# Patient Record
Sex: Male | Born: 1975 | Race: Black or African American | Hispanic: No | Marital: Married | Smoking: Never smoker
Health system: Southern US, Community
[De-identification: ages and names within clinical notes are randomized; demographics above are authoritative.]

## PROBLEM LIST (undated history)

## (undated) DIAGNOSIS — I10 Essential (primary) hypertension: Secondary | ICD-10-CM

## (undated) DIAGNOSIS — R52 Pain, unspecified: Secondary | ICD-10-CM

## (undated) DIAGNOSIS — E119 Type 2 diabetes mellitus without complications: Secondary | ICD-10-CM

## (undated) HISTORY — DX: Pain, unspecified: R52

## (undated) HISTORY — PX: CHEEK AUGMENTATION: SHX1335

## (undated) HISTORY — DX: Type 2 diabetes mellitus without complications: E11.9

---
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
## 2005-06-22 HISTORY — PX: FINGER SURGERY: SHX640

## 2008-06-25 ENCOUNTER — Emergency Department (HOSPITAL_COMMUNITY): Admission: EM | Admit: 2008-06-25 | Discharge: 2008-06-26 | Payer: Self-pay | Admitting: Emergency Medicine

## 2010-10-06 LAB — RAPID URINE DRUG SCREEN, HOSP PERFORMED
Amphetamines: NOT DETECTED
Barbiturates: NOT DETECTED
Benzodiazepines: NOT DETECTED
Cocaine: NOT DETECTED
Opiates: POSITIVE — AB
Tetrahydrocannabinol: NOT DETECTED

## 2010-11-04 NOTE — Consult Note (Signed)
NAMEALLEX, LAPOINT              ACCOUNT NO.:  [id number]   MEDICAL RECORD NO.:  [id number]          PATIENT TYPE:  EMS   LOCATION:  MAJO                         FACILITY:  [facility]   PHYSICIAN:  [name], M.D.DATE OF BIRTH:  1976/02/19   DATE OF CONSULTATION:  06/25/2008  DATE OF DISCHARGE:                                 CONSULTATION   REQUESTING PHYSICIAN:  Orlene Och, MD   REASON FOR CONSULTATION:  Michael Shannon is a 35 year old right-hand-  dominant male who was injured earlier today at work who presents today  with traumatic soft tissue loss of volar aspect, nondominant left long  finger with exposed distal phalanx.  He is 35 years old.  He has an  allergy to penicillin.  He has no other medications.  No recent  hospitalizations or surgery.   PAST MEDICAL HISTORY:  Noncontributory.   SOCIAL HISTORY:  Noncontributory.   PHYSICAL EXAMINATION:  Examination reveals a well-developed and well-  nourished male, pleasant, alert, and oriented x3.  Examination of his  hand:  In the left, he has an obvious soft tissue defect over the volar  aspect of the long finger from the DIP flexion crease to about 5-7 mm  from the tip of the finger with exposed distal phalanx.  FDS is intact.  FDP is intact.  The tip of his finger remained sensate, although he has  a large soft tissue loss down to the bone.  X-rays show soft tissue loss  only.  The rest of his right and left upper extremity exams are normal  by comparison.   Approximately, a 35 year old male with significant soft tissue loss of  volar aspect, nondominant left long finger.  I had a thorough discussion  regarding this current predicament with Mr. Helzer in the emergency room  this evening.  He clearly has soft tissue loss with exposed tendon and  needs soft tissue coverage.  Recommendations would be to either proceed  with a revision amputation, which would encompass the DIP joint versus a  cross finger flap or  full-thickness skin grafting.  He does not wish to  proceed with amputation, which I think is not reasonable and I agree  with him that we should proceed with soft tissue coverage.  Therefore,  we have discussed either a full-thickness skin grafting versus a cross  finger flap.  He agrees on the cross finger flap, which will be done  from the ring finger to the long finger with full-thickness skin  grafting from the distal wrist crease.  We will do this in the operating  room tonight as an emergent case.      Artist Pais Mina Marble, M.D.  Electronically Signed     MAW/MEDQ  D:  06/25/2008  T:  06/26/2008  Job:  [id number]

## 2010-11-04 NOTE — Op Note (Signed)
NAMENEVAAN, Michael Shannon              ACCOUNT NO.:  [id number]   MEDICAL RECORD NO.:  [id number]          PATIENT TYPE:  EMS   LOCATION:  MAJO                         FACILITY:  [facility]   PHYSICIAN:  [name], M.D.DATE OF BIRTH:  07-Jul-1975   DATE OF PROCEDURE:  06/25/2008  DATE OF DISCHARGE:  06/26/2008                               OPERATIVE REPORT   PREOPERATIVE DIAGNOSIS:  Traumatic soft tissue loss volar aspect left  long finger.   POSTOPERATIVE DIAGNOSIS:  Traumatic soft tissue loss volar aspect left  long finger.   PROCEDURE:  Irrigation debridement above with left ring to left long  cross finger flap.   SURGEON:  Artist Pais. Mina Marble, MD   ASSISTANT:  None.   ANESTHESIA:  General.   TOURNIQUET TIME:  58 minutes.   COMPLICATION:  None.   DRAINS:  None.   OPERATIVE REPORT:  The patient was taken to the operating suite.  After  induction of adequate general anesthesia, the left upper extremity was  prepped and draped in the usual sterile fashion.  An Esmarch was used to  exsanguinate the limb.  Tourniquet was inflated to 250 mmHg.  At this  point in time, visualization revealed soft tissue loss over the pulp of  the left long finger with exposed distal phalanx.  This was irrigated  until all devitalized tissue was removed.  The defect was approximately  1.5 cm x 1.5 cm almost in square, from the DIP flexion crease to level  of the tip of the finger.  At this point in time, the hand was fully  pronated and a cross finger flap was elevated off the ring finger with a  radially based hinge swung into place and was deemed to be in good  position.  At this point in time, the hand was supinated again.  A full-  thickness skin graft was harvested from the distal wrist and forearm  crease.  This was then closed primarily with a 3-0 Prolene subcuticular  stitch with 1 horizontal mattress suture to decrease tension.  This full-  thickness skin graft was then placed over  the defect on the dorsal  aspect of the ring finger over the middle phalanx.  This was sewn on  with 4-0 nylon at the corners for future bolster sutures and a running 4-  0 Vicryl Rapide.  Once this was done, the flap was inset on the volar  aspect of the long finger and sutured with 4-0 Vicryl Rapide.  The inset  was finished.  After the inset was finished, a cotton soaked 4 x 4 was  placed on top of Xeroform on top of the full-thickness skin graft and  tied over using a bolster technique using the 4 corner sutures of 4-0  nylon.  After this was completed, the insetted flap was then dressed  with Xeroform and insetted flap was also sutured with 4-0 Vicryl Rapide.  At the end of the procedure, the donor site was dressed with Steri-  Strips  and Benzoin and 4 x 4s placed between the fingers and a compressive  dressing was applied  to the entire hand.  The wrist and fingers remained  flexed and the patient had dorsal extension block splint placed to  protect his cross-finger flap.  The patient tolerated all procedures  well and went to recovery room in stable fashion.      Artist Pais Mina Marble, M.D.  Electronically Signed     MAW/MEDQ  D:  06/25/2008  T:  06/26/2008  Job:  [id number]

## 2013-09-07 ENCOUNTER — Encounter: Payer: Managed Care, Other (non HMO) | Attending: Physician Assistant

## 2013-09-07 VITALS — Ht 69.0 in | Wt 238.2 lb

## 2013-09-07 DIAGNOSIS — E119 Type 2 diabetes mellitus without complications: Secondary | ICD-10-CM | POA: Insufficient documentation

## 2013-09-07 DIAGNOSIS — Z713 Dietary counseling and surveillance: Secondary | ICD-10-CM | POA: Insufficient documentation

## 2013-09-08 NOTE — Progress Notes (Signed)
Patient was seen on 09/07/13 for the first of a series of three diabetes self-management courses at the [facility].  Current HbA1c: 9.5%  The following learning objectives were met by the patient during this class:  Describe diabetes  State some common risk factors for diabetes  Defines the role of glucose and insulin  Identifies type of diabetes and pathophysiology  Describe the relationship between diabetes and cardiovascular risk  State the members of the Healthcare Team  States the rationale for glucose monitoring  State when to test glucose  State their individual Target Range  State the importance of logging glucose readings  Describe how to interpret glucose readings  Identifies A1C target  Explain the correlation between A1c and eAG values  State symptoms and treatment of high blood glucose  State symptoms and treatment of low blood glucose  Explain proper technique for glucose testing  Identifies proper sharps disposal  Handouts given during class include:  Living Well with Diabetes book  Carb Counting and Meal Planning book  Meal Plan Card  Carbohydrate guide  Meal planning worksheet  Low Sodium Flavoring Tips  The diabetes portion plate  H7S to eAG Conversion Chart  Diabetes Medications  Diabetes Recommended Care Schedule  Support Group  Diabetes Success Plan  Core Class Satisfaction Survey  Follow-Up Plan:  Attend core 2

## 2013-09-14 DIAGNOSIS — E119 Type 2 diabetes mellitus without complications: Secondary | ICD-10-CM

## 2013-09-14 NOTE — Progress Notes (Signed)
Patient was seen on 09/14/13 for the second of a series of three diabetes self-management courses at the [facility]. The following learning objectives were met by the patient during this class:   Describe the role of different macronutrients on glucose  Explain how carbohydrates affect blood glucose  State what foods contain the most carbohydrates  Demonstrate carbohydrate counting  Demonstrate how to read Nutrition Facts food label  Describe effects of various fats on heart health  Describe the importance of good nutrition for health and healthy eating strategies  Describe techniques for managing your shopping, cooking and meal planning  List strategies to follow meal plan when dining out  Describe the effects of alcohol on glucose and how to use it safely  Goals:  Follow Diabetes Meal Plan as instructed  Eat 3 meals and 2 snacks, every 3-5 hrs  Aim for carbohydrate intake to 60 grams carbohydrate/meal Aim for carbohydrate intake to 30 grams carbohydrate/snack Add lean protein foods to meals/snacks  Monitor glucose levels as instructed by your doctor   Follow-Up Plan:  Attend Core 3  Work towards following your personal food plan.

## 2013-09-21 ENCOUNTER — Encounter: Payer: Managed Care, Other (non HMO) | Attending: Physician Assistant

## 2013-09-21 DIAGNOSIS — E119 Type 2 diabetes mellitus without complications: Secondary | ICD-10-CM

## 2013-09-21 DIAGNOSIS — Z713 Dietary counseling and surveillance: Secondary | ICD-10-CM | POA: Insufficient documentation

## 2013-09-21 NOTE — Progress Notes (Signed)
Patient was seen on 09/21/13 for the third of a series of three diabetes self-management courses at the [facility]. The following learning objectives were met by the patient during this class:    State the amount of activity recommended for healthy living   Describe activities suitable for individual needs   Identify ways to regularly incorporate activity into daily life   Identify barriers to activity and ways to over come these barriers  Identify diabetes medications being personally used and their primary action for lowering glucose and possible side effects   Describe role of stress on blood glucose and develop strategies to address psychosocial issues   Identify diabetes complications and ways to prevent them  Explain how to manage diabetes during illness   Evaluate success in meeting personal goal   Establish 2-3 goals that they will plan to diligently work on until they return for the  59-month follow-up visit  Goals:  Follow Diabetes Meal Plan as instructed  Aim for 15-30 mins of physical activity daily as tolerated  Bring food record and glucose log to your follow up visit  Your patient has established the following 4 month goals in their individualized success plan: I will count my carb choices at most meals and snacks I will increase my activity level at least 5 days a week I will take my diabetes medications as scheduled   Your patient has identified these potential barriers to change:  Family getting fast food.   Preparing meals for when I go to work  Your patient has identified their diabetes self-care support plan as  [facility] Support Group  Wife and mother  Plan:  Attend Core 4 in 4 months

## 2014-01-22 ENCOUNTER — Encounter: Payer: Managed Care, Other (non HMO) | Attending: Physician Assistant

## 2018-05-03 ENCOUNTER — Ambulatory Visit
Admission: RE | Admit: 2018-05-03 | Discharge: 2018-05-03 | Disposition: A | Discharge status: Home / Self Care | Payer: Managed Care, Other (non HMO) | Source: Ambulatory Visit | Attending: Family Medicine | Admitting: Family Medicine

## 2018-05-03 ENCOUNTER — Other Ambulatory Visit: Payer: Self-pay | Admitting: Family Medicine

## 2018-05-03 DIAGNOSIS — M545 Low back pain, unspecified: Secondary | ICD-10-CM

## 2018-05-03 DIAGNOSIS — R6889 Other general symptoms and signs: Secondary | ICD-10-CM | POA: Diagnosis not present

## 2018-05-03 DIAGNOSIS — M5416 Radiculopathy, lumbar region: Secondary | ICD-10-CM | POA: Diagnosis not present

## 2018-05-03 DIAGNOSIS — R5383 Other fatigue: Secondary | ICD-10-CM | POA: Diagnosis not present

## 2018-05-03 DIAGNOSIS — R8290 Unspecified abnormal findings in urine: Secondary | ICD-10-CM | POA: Diagnosis not present

## 2018-05-03 DIAGNOSIS — E1165 Type 2 diabetes mellitus with hyperglycemia: Secondary | ICD-10-CM | POA: Diagnosis not present

## 2018-05-03 DIAGNOSIS — R634 Abnormal weight loss: Secondary | ICD-10-CM | POA: Diagnosis not present

## 2018-05-05 ENCOUNTER — Other Ambulatory Visit: Payer: Self-pay | Admitting: Family Medicine

## 2018-05-05 DIAGNOSIS — M545 Low back pain, unspecified: Secondary | ICD-10-CM

## 2018-05-27 ENCOUNTER — Ambulatory Visit
Admission: RE | Admit: 2018-05-27 | Discharge: 2018-05-27 | Disposition: A | Discharge status: Home / Self Care | Payer: BLUE CROSS/BLUE SHIELD | Source: Ambulatory Visit | Attending: Family Medicine | Admitting: Family Medicine

## 2018-05-27 DIAGNOSIS — M545 Low back pain, unspecified: Secondary | ICD-10-CM

## 2018-05-27 DIAGNOSIS — M47816 Spondylosis without myelopathy or radiculopathy, lumbar region: Secondary | ICD-10-CM | POA: Diagnosis not present

## 2018-05-27 MED ORDER — GADOBENATE DIMEGLUMINE 529 MG/ML IV SOLN
18.0000 mL | Freq: Once | INTRAVENOUS | Status: AC | PRN
  Administered 2018-05-27: 18 mL via INTRAVENOUS

## 2018-06-02 DIAGNOSIS — M545 Low back pain: Secondary | ICD-10-CM | POA: Diagnosis not present

## 2018-06-02 DIAGNOSIS — E1165 Type 2 diabetes mellitus with hyperglycemia: Secondary | ICD-10-CM | POA: Diagnosis not present

## 2018-06-21 DIAGNOSIS — M545 Low back pain, unspecified: Secondary | ICD-10-CM | POA: Insufficient documentation

## 2018-06-29 DIAGNOSIS — M545 Low back pain: Secondary | ICD-10-CM | POA: Diagnosis not present

## 2018-07-08 DIAGNOSIS — M545 Low back pain: Secondary | ICD-10-CM | POA: Diagnosis not present

## 2018-07-15 DIAGNOSIS — M545 Low back pain: Secondary | ICD-10-CM | POA: Diagnosis not present

## 2018-07-28 DIAGNOSIS — M545 Low back pain: Secondary | ICD-10-CM | POA: Diagnosis not present

## 2018-08-01 ENCOUNTER — Encounter: Payer: Self-pay | Admitting: *Deleted

## 2018-08-02 ENCOUNTER — Encounter: Payer: Self-pay | Admitting: Neurology

## 2018-08-02 ENCOUNTER — Ambulatory Visit (INDEPENDENT_AMBULATORY_CARE_PROVIDER_SITE_OTHER): Payer: BLUE CROSS/BLUE SHIELD | Admitting: Neurology

## 2018-08-02 VITALS — BP 140/79 | HR 92 | Ht 70.0 in | Wt 181.8 lb

## 2018-08-02 DIAGNOSIS — M79604 Pain in right leg: Secondary | ICD-10-CM | POA: Diagnosis not present

## 2018-08-02 DIAGNOSIS — M79605 Pain in left leg: Secondary | ICD-10-CM

## 2018-08-02 MED ORDER — DULOXETINE HCL 60 MG PO CPEP: 60.0000 mg | ORAL_CAPSULE | Freq: Every day | ORAL | 6 refills | Status: DC

## 2018-08-02 NOTE — Progress Notes (Signed)
PATIENT: Michael Shannon DOB: 1975/11/30  Chief Complaint  Patient presents with  . Pain    Reports constant achy pains and intermittent sharp pains in his left leg, left foot and right foot.  Denies weakness.  No back pain. He has not tried any medication for his symptoms.  Marland Kitchen PCP    Aliene Beams, MD     HISTORICAL  Michael Shannon is a 43 year old male, seen in request by his primary care physician Dr. Tracie Harrier, [name] for evaluation of bilateral lower extremity intermittent pain, initial evaluation was on 2020.  I have reviewed and summarized the referring note from the referring physician.  He had past medical history of hypertension, diabetes since 2018.   Since September 2019, without clear triggers, he noted intermittent achy pain involving different spots in his left lower extremity, could be left lateral leg, calf, inner thigh and, foot, short lasting, but multiple recurrent episode, he denies significant gait abnormality, he works at [employer] job, denies difficulty heavy lifting, upper extremity paresthesia or weakness  Since January 2020, he began to noticed intermittent right lower extremity paresthesia and pain, he denies bowel and bladder incontinence, he denies significant low back pain  I personally reviewed MRI of lumbar with without contrast in December 2019, mild degenerative changes, no significant foraminal or canal stenosis.  REVIEW OF SYSTEMS: Full 14 system review of systems performed and notable only for weight loss All other review of systems were negative.  ALLERGIES: Allergies  Allergen Reactions  . Penicillins Other (See Comments)    Unsure of reaction.    HOME MEDICATIONS: Current Outpatient Medications  Medication Sig Dispense Refill  . aspirin 81 MG tablet Take 81 mg by mouth daily.    Marland Kitchen glipiZIDE (GLUCOTROL XL) 2.5 MG 24 hr tablet Take 2.5 mg by mouth daily.    Marland Kitchen lisinopril (PRINIVIL,ZESTRIL) 5 MG tablet Take 5 mg by mouth daily.    .  metFORMIN (GLUCOPHAGE) 1000 MG tablet Take 1,000 mg by mouth 2 (two) times daily.    . sitaGLIPtin (JANUVIA) 100 MG tablet Take 100 mg by mouth daily.     No current facility-administered medications for this visit.     PAST MEDICAL HISTORY: Past Medical History:  Diagnosis Date  . Diabetes mellitus without complication (HCC)   . Pain     PAST SURGICAL HISTORY: Past Surgical History:  Procedure Laterality Date  . digestive surgery  1978  . FINGER SURGERY Left 2007   middle finger    FAMILY HISTORY: Family History  Problem Relation Age of Onset  . Diabetes Mother   . Hypertension Mother   . Other Father        overdose  . Diabetes Maternal Grandfather   . Hypertension Maternal Grandfather     SOCIAL HISTORY: Social History   Socioeconomic History  . Marital status: Married    Spouse name: Not on file  . Number of children: 3  . Years of education: 66  . Highest education level: High school graduate  Occupational History  . Occupation: Chartered certified accountant  Social Needs  . Financial resource strain: Not on file  . Food insecurity:    Worry: Not on file    Inability: Not on file  . Transportation needs:    Medical: Not on file    Non-medical: Not on file  Tobacco Use  . Smoking status: Never Smoker  . Smokeless tobacco: Never Used  Substance and Sexual Activity  . Alcohol use: Yes  Comment: occasionally - 1-2 drinks per month  . Drug use: Never  . Sexual activity: Not on file  Lifestyle  . Physical activity:    Days per week: Not on file    Minutes per session: Not on file  . Stress: Not on file  Relationships  . Social connections:    Talks on phone: Not on file    Gets together: Not on file    Attends religious service: Not on file    Active member of club or organization: Not on file    Attends meetings of clubs or organizations: Not on file    Relationship status: Not on file  . Intimate partner violence:    Fear of current or ex partner: Not on file     Emotionally abused: Not on file    Physically abused: Not on file    Forced sexual activity: Not on file  Other Topics Concern  . Not on file  Social History Narrative   Lives with his wife and children.   Right-handed.   2-3 caffeine drinks per week.     PHYSICAL EXAM   Vitals:   08/02/18 0840  BP: 140/79  Pulse: 92  Weight: 181 lb 12 oz (82.4 kg)  Height: 5\' 10"  (1.778 m)    Not recorded      Body mass index is 26.08 kg/m.  PHYSICAL EXAMNIATION:  Gen: NAD, conversant, well nourised, obese, well groomed                     Cardiovascular: Regular rate rhythm, no peripheral edema, warm, nontender. Eyes: Conjunctivae clear without exudates or hemorrhage Neck: Supple, no carotid bruits. Pulmonary: Clear to auscultation bilaterally   NEUROLOGICAL EXAM:  MENTAL STATUS: Speech:    Speech is normal; fluent and spontaneous with normal comprehension.  Cognition:     Orientation to time, place and person     Normal recent and remote memory     Normal Attention span and concentration     Normal Language, naming, repeating,spontaneous speech     Fund of knowledge   CRANIAL NERVES: CN II: Visual fields are full to confrontation. Fundoscopic exam is normal with sharp discs and no vascular changes. Pupils are round equal and briskly reactive to light. CN III, IV, VI: extraocular movement are normal. No ptosis. CN V: Facial sensation is intact to pinprick in all 3 divisions bilaterally. Corneal responses are intact.  CN VII: Face is symmetric with normal eye closure and smile. CN VIII: Hearing is normal to rubbing fingers CN IX, X: Palate elevates symmetrically. Phonation is normal. CN XI: Head turning and shoulder shrug are intact CN XII: Tongue is midline with normal movements and no atrophy.  MOTOR: There is no pronator drift of out-stretched arms. Muscle bulk and tone are normal. Muscle strength is normal.  REFLEXES: Reflexes are 2+ and symmetric at the biceps,  triceps, knees, and ankles. Plantar responses are flexor.  SENSORY: Intact to light touch, pinprick, positional sensation and vibratory sensation are intact in fingers and toes.  COORDINATION: Rapid alternating movements and fine finger movements are intact. There is no dysmetria on finger-to-nose and heel-knee-shin.    GAIT/STANCE: Posture is normal. Gait is steady with normal steps, base, arm swing, and turning. Heel and toe walking are normal. Tandem gait is normal.  Romberg is absent.   DIAGNOSTIC DATA (LABS, IMAGING, TESTING) - I reviewed patient records, labs, notes, testing and imaging myself where available.   ASSESSMENT AND  PLAN  Michael Shannon is a 43 y.o. male   Intermittent bilateral lower extremity pain paresthesia,  Need to rule out peripheral neuropathy  EMG nerve conduction study  Laboratory evaluations for potential etiology    Levert FeinsteinYijun Jinx Gilden, M.D. Ph.D.  [facility] [address], [address] [place], [place] [address] Ph: [phone] Fax: [phone]  CC: Referring Provider

## 2018-08-04 ENCOUNTER — Telehealth: Payer: Self-pay | Admitting: Neurology

## 2018-08-04 DIAGNOSIS — E119 Type 2 diabetes mellitus without complications: Secondary | ICD-10-CM | POA: Insufficient documentation

## 2018-08-04 LAB — SEDIMENTATION RATE: Sed Rate: 2 mm/hr (ref 0–15)

## 2018-08-04 LAB — COMPREHENSIVE METABOLIC PANEL
ALBUMIN: 4.4 g/dL (ref 4.0–5.0)
ALT: 12 IU/L (ref 0–44)
AST: 15 IU/L (ref 0–40)
Albumin/Globulin Ratio: 1.8 (ref 1.2–2.2)
Alkaline Phosphatase: 45 IU/L (ref 39–117)
BUN / CREAT RATIO: 13 (ref 9–20)
BUN: 13 mg/dL (ref 6–24)
Bilirubin Total: 0.5 mg/dL (ref 0.0–1.2)
CHLORIDE: 102 mmol/L (ref 96–106)
CO2: 24 mmol/L (ref 20–29)
Calcium: 9.5 mg/dL (ref 8.7–10.2)
Creatinine, Ser: 0.98 mg/dL (ref 0.76–1.27)
GFR, EST AFRICAN AMERICAN: 109 mL/min/{1.73_m2} (ref >59)
GFR, EST NON AFRICAN AMERICAN: 95 mL/min/{1.73_m2} (ref >59)
GLUCOSE: 125 mg/dL — AB (ref 65–99)
Globulin, Total: 2.4 g/dL (ref 1.5–4.5)
Potassium: 4.7 mmol/L (ref 3.5–5.2)
Sodium: 140 mmol/L (ref 134–144)
TOTAL PROTEIN: 6.8 g/dL (ref 6.0–8.5)

## 2018-08-04 LAB — CBC WITH DIFFERENTIAL
Basophils Absolute: 0 10*3/uL (ref 0.0–0.2)
Basos: 0 %
EOS (ABSOLUTE): 0 10*3/uL (ref 0.0–0.4)
EOS: 0 %
HEMATOCRIT: 40.4 % (ref 37.5–51.0)
Hemoglobin: 13.1 g/dL (ref 13.0–17.7)
IMMATURE GRANULOCYTES: 0 %
Immature Grans (Abs): 0 10*3/uL (ref 0.0–0.1)
LYMPHS ABS: 2.7 10*3/uL (ref 0.7–3.1)
Lymphs: 28 %
MCH: 26.5 pg — ABNORMAL LOW (ref 26.6–33.0)
MCHC: 32.4 g/dL (ref 31.5–35.7)
MCV: 82 fL (ref 79–97)
MONOS ABS: 0.8 10*3/uL (ref 0.1–0.9)
Monocytes: 8 %
Neutrophils Absolute: 6.2 10*3/uL (ref 1.4–7.0)
Neutrophils: 64 %
RBC: 4.94 x10E6/uL (ref 4.14–5.80)
RDW: 13.9 % (ref 11.6–15.4)
WBC: 9.8 10*3/uL (ref 3.4–10.8)

## 2018-08-04 LAB — FOLATE: FOLATE: 8.9 ng/mL (ref >3.0)

## 2018-08-04 LAB — HEMOGLOBIN A1C
ESTIMATED AVERAGE GLUCOSE: 148 mg/dL
Hgb A1c MFr Bld: 6.8 % — ABNORMAL HIGH (ref 4.8–5.6)

## 2018-08-04 LAB — VITAMIN B12: Vitamin B-12: 742 pg/mL (ref 232–1245)

## 2018-08-04 LAB — RPR: RPR: NONREACTIVE

## 2018-08-04 LAB — VITAMIN D 25 HYDROXY (VIT D DEFICIENCY, FRACTURES): Vit D, 25-Hydroxy: 10.2 ng/mL — ABNORMAL LOW (ref 30.0–100.0)

## 2018-08-04 LAB — TSH: TSH: 0.938 u[IU]/mL (ref 0.450–4.500)

## 2018-08-04 LAB — C-REACTIVE PROTEIN

## 2018-08-04 LAB — CK: CK TOTAL: 186 U/L (ref 24–204)

## 2018-08-04 NOTE — Telephone Encounter (Signed)
Left message requesting a return call.

## 2018-08-04 NOTE — Telephone Encounter (Signed)
Laboratory evaluations showed low vitamin D 10, he would benefit over-the-counter vitamin D3 supplement 3000 units daily, will repeat laboratory evaluations in few months,  Also elevated A1c 6.8, consistent with his diagnosis of diabetes,  Otherwise there is no significant abnormality

## 2018-08-04 NOTE — Telephone Encounter (Signed)
Left patient a detailed message, with results and recommended supplement, on voicemail (ok per DPR).  Provided our number to call back with any questions.  

## 2018-08-05 DIAGNOSIS — E1165 Type 2 diabetes mellitus with hyperglycemia: Secondary | ICD-10-CM | POA: Diagnosis not present

## 2018-08-05 DIAGNOSIS — I10 Essential (primary) hypertension: Secondary | ICD-10-CM | POA: Diagnosis not present

## 2018-08-05 DIAGNOSIS — R634 Abnormal weight loss: Secondary | ICD-10-CM | POA: Diagnosis not present

## 2018-08-31 DIAGNOSIS — N529 Male erectile dysfunction, unspecified: Secondary | ICD-10-CM | POA: Diagnosis not present

## 2018-08-31 DIAGNOSIS — I10 Essential (primary) hypertension: Secondary | ICD-10-CM | POA: Diagnosis not present

## 2018-08-31 DIAGNOSIS — R6882 Decreased libido: Secondary | ICD-10-CM | POA: Diagnosis not present

## 2018-08-31 DIAGNOSIS — E1165 Type 2 diabetes mellitus with hyperglycemia: Secondary | ICD-10-CM | POA: Diagnosis not present

## 2018-09-09 ENCOUNTER — Encounter: Payer: BLUE CROSS/BLUE SHIELD | Admitting: Neurology

## 2018-10-13 DIAGNOSIS — Z23 Encounter for immunization: Secondary | ICD-10-CM | POA: Diagnosis not present

## 2018-10-13 DIAGNOSIS — N529 Male erectile dysfunction, unspecified: Secondary | ICD-10-CM | POA: Diagnosis not present

## 2018-10-13 DIAGNOSIS — E1165 Type 2 diabetes mellitus with hyperglycemia: Secondary | ICD-10-CM | POA: Diagnosis not present

## 2018-10-13 DIAGNOSIS — Z0000 Encounter for general adult medical examination without abnormal findings: Secondary | ICD-10-CM | POA: Diagnosis not present

## 2018-10-13 DIAGNOSIS — Z125 Encounter for screening for malignant neoplasm of prostate: Secondary | ICD-10-CM | POA: Diagnosis not present

## 2018-10-13 DIAGNOSIS — I10 Essential (primary) hypertension: Secondary | ICD-10-CM | POA: Diagnosis not present

## 2018-10-13 DIAGNOSIS — E782 Mixed hyperlipidemia: Secondary | ICD-10-CM | POA: Diagnosis not present

## 2018-10-13 DIAGNOSIS — R6882 Decreased libido: Secondary | ICD-10-CM | POA: Diagnosis not present

## 2018-10-22 ENCOUNTER — Other Ambulatory Visit: Payer: Self-pay | Admitting: Neurology

## 2019-01-12 ENCOUNTER — Other Ambulatory Visit: Payer: Self-pay | Admitting: Family Medicine

## 2019-01-12 DIAGNOSIS — R6889 Other general symptoms and signs: Secondary | ICD-10-CM | POA: Diagnosis not present

## 2019-01-12 DIAGNOSIS — Z20822 Contact with and (suspected) exposure to covid-19: Secondary | ICD-10-CM

## 2019-01-16 LAB — NOVEL CORONAVIRUS, NAA: SARS-CoV-2, NAA: NOT DETECTED

## 2019-10-16 DIAGNOSIS — Z0000 Encounter for general adult medical examination without abnormal findings: Secondary | ICD-10-CM | POA: Diagnosis not present

## 2019-10-16 DIAGNOSIS — I10 Essential (primary) hypertension: Secondary | ICD-10-CM | POA: Diagnosis not present

## 2019-10-16 DIAGNOSIS — G629 Polyneuropathy, unspecified: Secondary | ICD-10-CM | POA: Diagnosis not present

## 2019-10-16 DIAGNOSIS — E782 Mixed hyperlipidemia: Secondary | ICD-10-CM | POA: Diagnosis not present

## 2019-10-16 DIAGNOSIS — E1165 Type 2 diabetes mellitus with hyperglycemia: Secondary | ICD-10-CM | POA: Diagnosis not present

## 2019-10-16 DIAGNOSIS — Z125 Encounter for screening for malignant neoplasm of prostate: Secondary | ICD-10-CM | POA: Diagnosis not present

## 2019-11-16 DIAGNOSIS — E782 Mixed hyperlipidemia: Secondary | ICD-10-CM | POA: Diagnosis not present

## 2019-11-16 DIAGNOSIS — I10 Essential (primary) hypertension: Secondary | ICD-10-CM | POA: Diagnosis not present

## 2019-11-16 DIAGNOSIS — E1165 Type 2 diabetes mellitus with hyperglycemia: Secondary | ICD-10-CM | POA: Diagnosis not present

## 2020-08-01 IMAGING — MR MR LUMBAR SPINE WO/W CM
4 of 7 series · 17 of 48 positions shown · IV contrast (Multihance 18ml)
Comparison: Lumbar spine radiographs 05/03/2018

CLINICAL DATA: Low back pain radiating into the left buttock and
leg with muscle atrophy. Symptoms for 4 months.

EXAM:
MRI LUMBAR SPINE WITHOUT AND WITH CONTRAST
TECHNIQUE: Multiplanar and multiecho pulse sequences of the lumbar spine were
obtained without and with intravenous contrast.
CONTRAST:  18mL MULTIHANCE GADOBENATE DIMEGLUMINE 529 MG/ML IV SOLN

[Series 6: T2 · sagittal · 4.0mm · 0.73mm/px · 3 of 15 slices shown (1 of 2)]
[im 1/15]
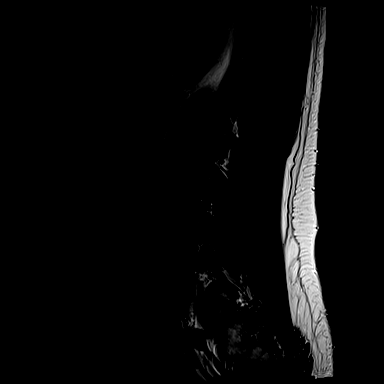
[im 8/15]
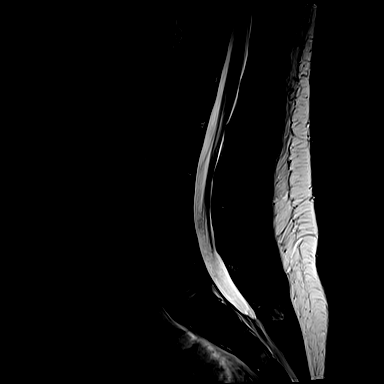
[im 15/15]
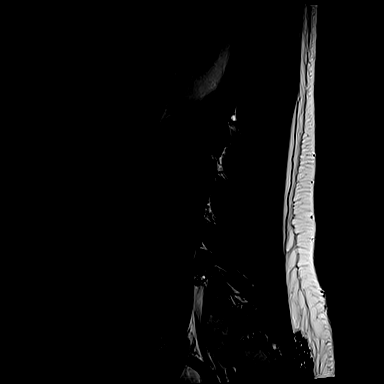

[Series 7: T1 · sagittal · 4.0mm · 0.73mm/px · 3 of 15 slices shown (1 of 2)]
[im 1/15]
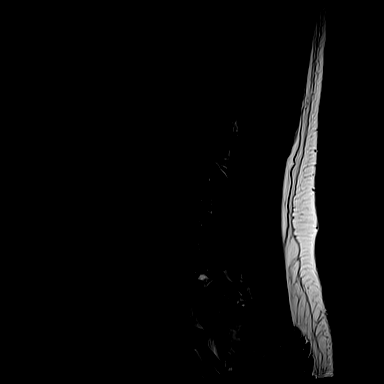
[im 10/15]
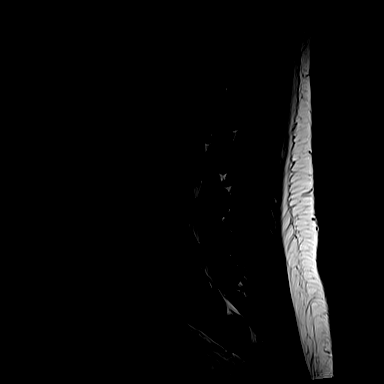
[im 15/15]
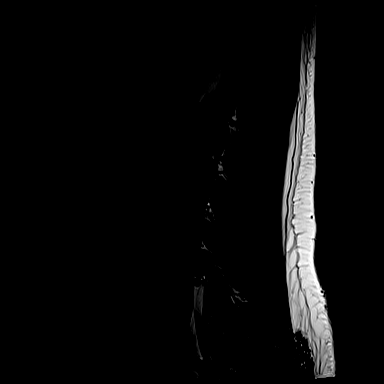

[Series 11: T1 · axial · 4.0mm · 0.28mm/px · z∈[-73,+116]mm · 3 of 43 slices shown (2 of 2)]
[im 5/43]
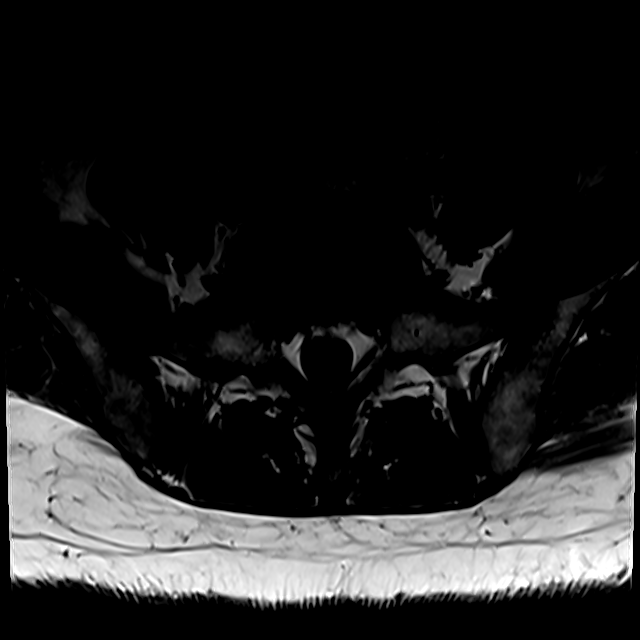
[im 22/43]
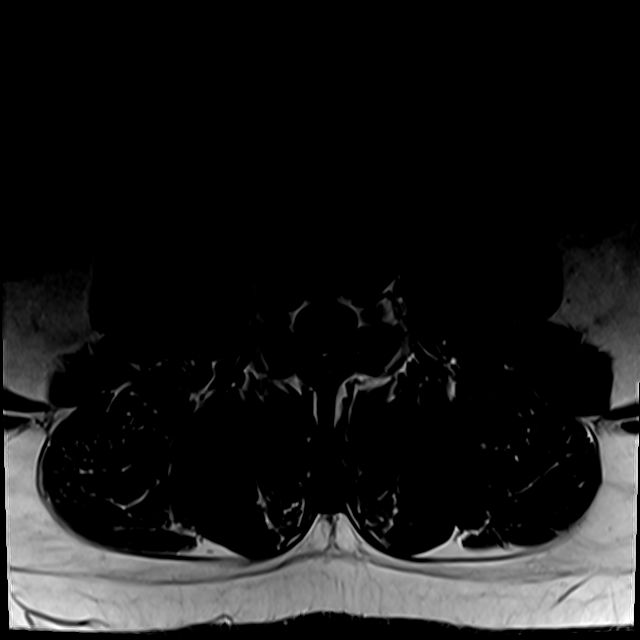
[im 38/43]
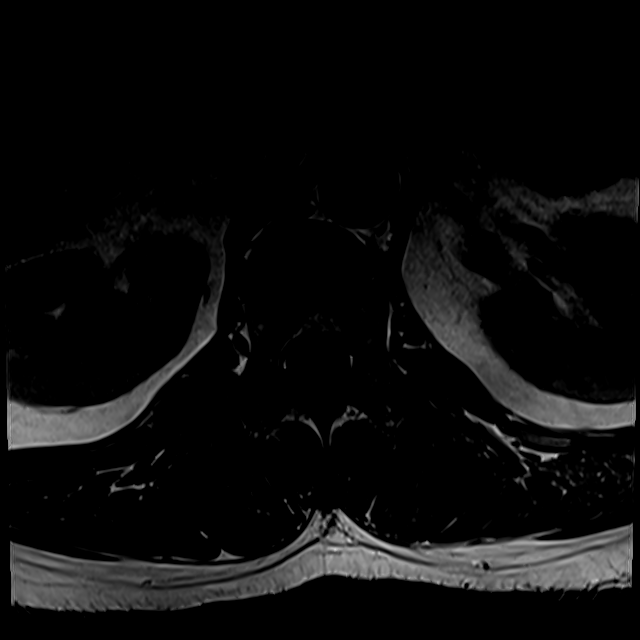

[Series 14: T2 · axial · 4.0mm · 0.28mm/px · z∈[-93,+116]mm · 8 of 43 slices shown (2 of 2)]
[im 1/43]
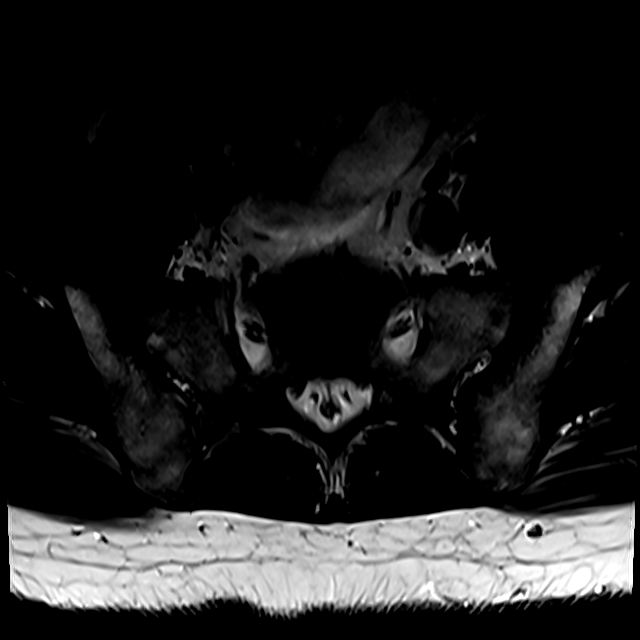
[im 5/43]
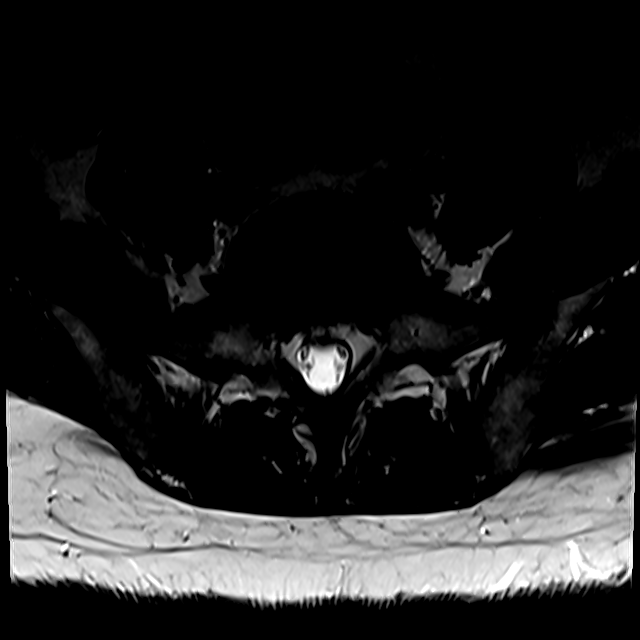
[im 9/43]
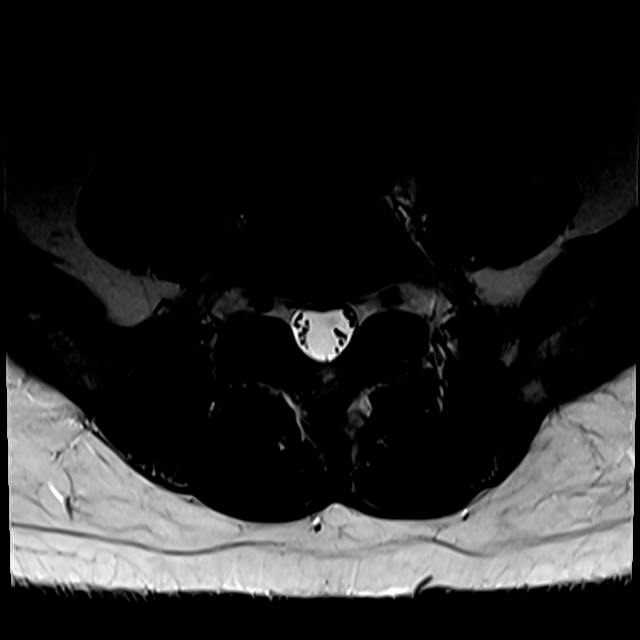
[im 13/43]
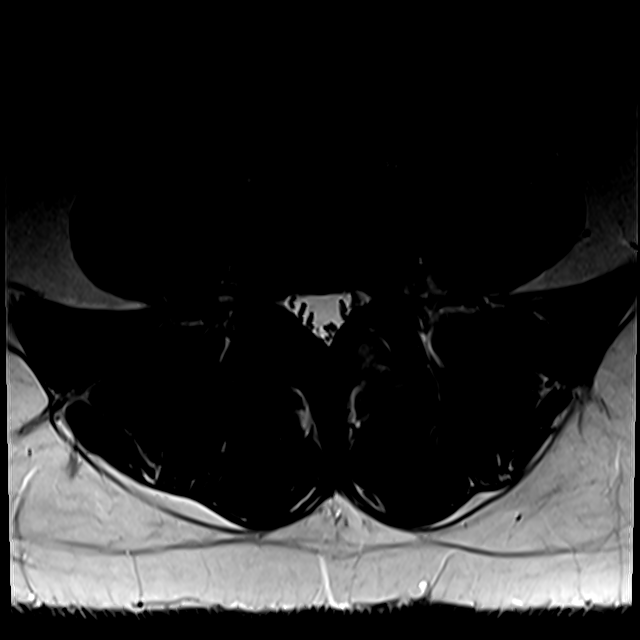
[im 17/43]
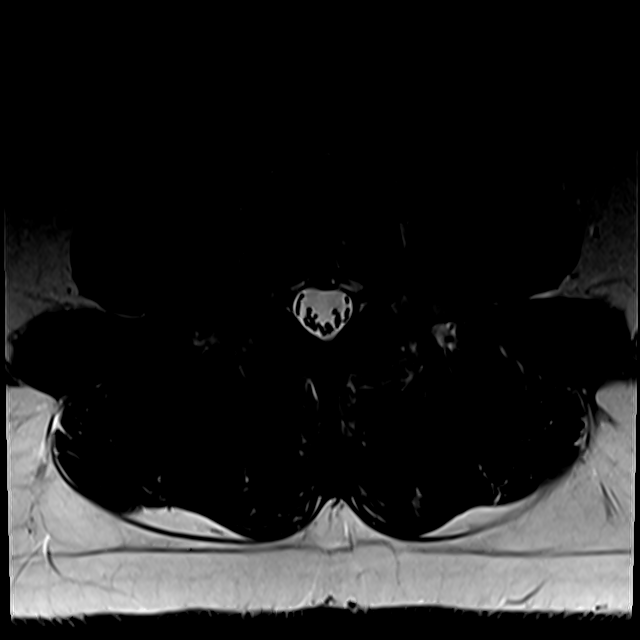
[im 22/43]
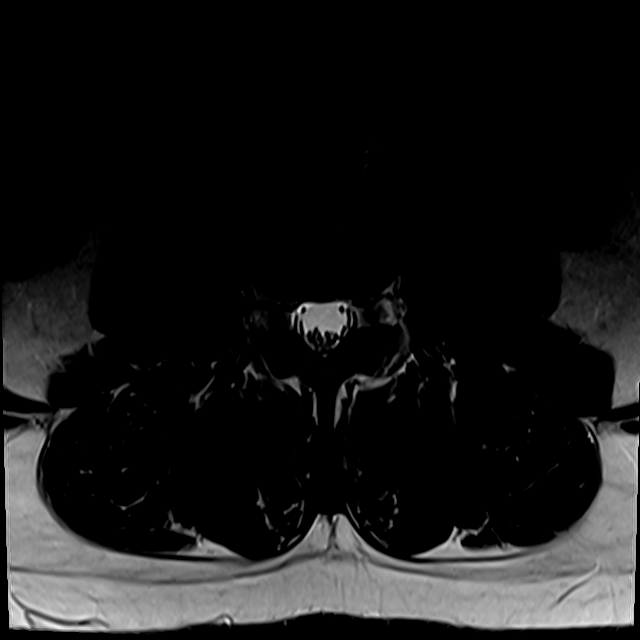
[im 26/43]
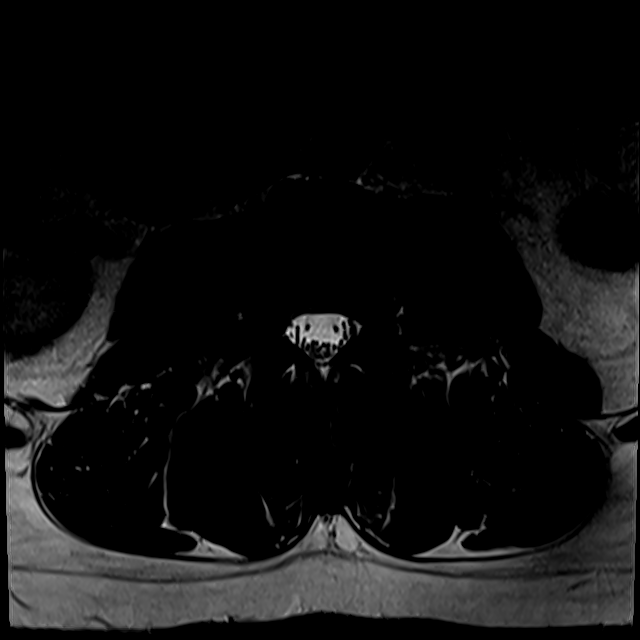
[im 38/43]
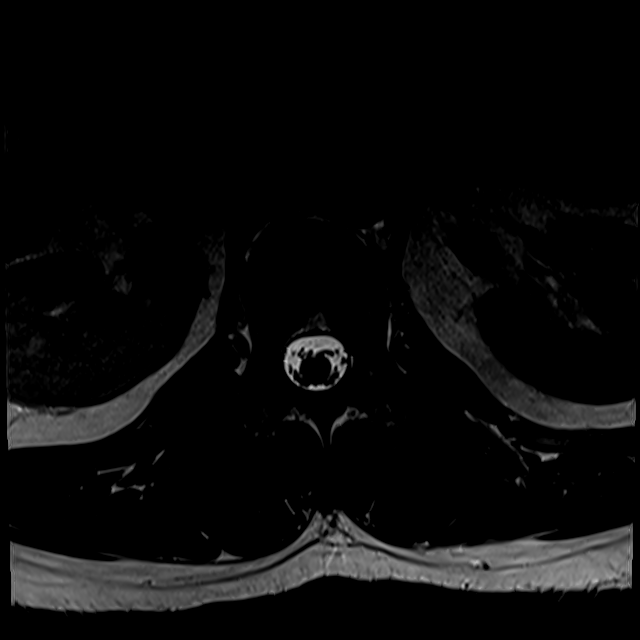

[17 of 48 positions shown; findings below may reference images not displayed]

FINDINGS: Segmentation:  Standard.

Alignment:  Normal.

Vertebrae: Slight anterior irregularity of the L1 superior endplate
on radiographs is primarily attributed to spondylosis with only at
most minimal anterior wedging demonstrated on MRI. Bone marrow
signal is heterogeneous with predominantly diminished T1 signal
intensity relatively diffusely, nonspecific though can be seen with
anemia, smoking, and obesity. No suspicious focal marrow lesion is
identified.

Conus medullaris and cauda equina: Conus extends to the upper L2
level. Conus and cauda equina appear normal.

Paraspinal and other soft tissues: Unremarkable.

Disc levels:

Mild anterior vertebral osteophyte formation at T11-12 greater than
T12-L1. Preserved disc height and hydration throughout the lumbar
spine. Mild facet hypertrophy at L3-4 and L4-5. No disc herniation.
Widely patent spinal canal and neural foramina throughout.
IMPRESSION: Minimal lumbar spondylosis and facet hypertrophy without disc
herniation or evidence of neural impingement.

## 2020-10-17 DIAGNOSIS — Z20822 Contact with and (suspected) exposure to covid-19: Secondary | ICD-10-CM | POA: Diagnosis not present

## 2022-08-14 ENCOUNTER — Other Ambulatory Visit: Payer: Self-pay

## 2022-08-14 ENCOUNTER — Inpatient Hospital Stay (HOSPITAL_COMMUNITY): Payer: BC Managed Care – PPO

## 2022-08-14 ENCOUNTER — Inpatient Hospital Stay (HOSPITAL_COMMUNITY)
Admission: EM | Admit: 2022-08-14 | Discharge: 2022-08-16 | DRG: 177 | Disposition: A | Discharge status: Home / Self Care | Payer: BC Managed Care – PPO | Attending: Internal Medicine | Admitting: Internal Medicine

## 2022-08-14 ENCOUNTER — Encounter (HOSPITAL_COMMUNITY): Payer: Self-pay

## 2022-08-14 ENCOUNTER — Emergency Department (HOSPITAL_COMMUNITY): Payer: BC Managed Care – PPO

## 2022-08-14 ENCOUNTER — Other Ambulatory Visit (HOSPITAL_COMMUNITY): Payer: BC Managed Care – PPO

## 2022-08-14 DIAGNOSIS — Z7982 Long term (current) use of aspirin: Secondary | ICD-10-CM

## 2022-08-14 DIAGNOSIS — I10 Essential (primary) hypertension: Secondary | ICD-10-CM | POA: Diagnosis not present

## 2022-08-14 DIAGNOSIS — R739 Hyperglycemia, unspecified: Secondary | ICD-10-CM | POA: Diagnosis not present

## 2022-08-14 DIAGNOSIS — Z880 Allergy status to penicillin: Secondary | ICD-10-CM

## 2022-08-14 DIAGNOSIS — M545 Low back pain, unspecified: Secondary | ICD-10-CM | POA: Diagnosis present

## 2022-08-14 DIAGNOSIS — E1159 Type 2 diabetes mellitus with other circulatory complications: Secondary | ICD-10-CM | POA: Diagnosis not present

## 2022-08-14 DIAGNOSIS — I110 Hypertensive heart disease with heart failure: Secondary | ICD-10-CM | POA: Diagnosis not present

## 2022-08-14 DIAGNOSIS — I5033 Acute on chronic diastolic (congestive) heart failure: Secondary | ICD-10-CM | POA: Diagnosis not present

## 2022-08-14 DIAGNOSIS — I509 Heart failure, unspecified: Secondary | ICD-10-CM

## 2022-08-14 DIAGNOSIS — I5021 Acute systolic (congestive) heart failure: Secondary | ICD-10-CM | POA: Diagnosis not present

## 2022-08-14 DIAGNOSIS — Z7984 Long term (current) use of oral hypoglycemic drugs: Secondary | ICD-10-CM

## 2022-08-14 DIAGNOSIS — J9601 Acute respiratory failure with hypoxia: Secondary | ICD-10-CM | POA: Diagnosis not present

## 2022-08-14 DIAGNOSIS — R609 Edema, unspecified: Secondary | ICD-10-CM | POA: Diagnosis not present

## 2022-08-14 DIAGNOSIS — R0602 Shortness of breath: Principal | ICD-10-CM

## 2022-08-14 DIAGNOSIS — R600 Localized edema: Secondary | ICD-10-CM | POA: Diagnosis not present

## 2022-08-14 DIAGNOSIS — E1165 Type 2 diabetes mellitus with hyperglycemia: Secondary | ICD-10-CM | POA: Diagnosis not present

## 2022-08-14 DIAGNOSIS — E1100 Type 2 diabetes mellitus with hyperosmolarity without nonketotic hyperglycemic-hyperosmolar coma (NKHHC): Secondary | ICD-10-CM

## 2022-08-14 DIAGNOSIS — U071 COVID-19: Secondary | ICD-10-CM | POA: Diagnosis not present

## 2022-08-14 DIAGNOSIS — Z833 Family history of diabetes mellitus: Secondary | ICD-10-CM | POA: Diagnosis not present

## 2022-08-14 DIAGNOSIS — Z8249 Family history of ischemic heart disease and other diseases of the circulatory system: Secondary | ICD-10-CM | POA: Diagnosis not present

## 2022-08-14 DIAGNOSIS — Z91148 Patient's other noncompliance with medication regimen for other reason: Secondary | ICD-10-CM

## 2022-08-14 DIAGNOSIS — Z79899 Other long term (current) drug therapy: Secondary | ICD-10-CM

## 2022-08-14 DIAGNOSIS — R059 Cough, unspecified: Secondary | ICD-10-CM | POA: Diagnosis not present

## 2022-08-14 DIAGNOSIS — E119 Type 2 diabetes mellitus without complications: Secondary | ICD-10-CM

## 2022-08-14 DIAGNOSIS — J811 Chronic pulmonary edema: Secondary | ICD-10-CM | POA: Diagnosis not present

## 2022-08-14 HISTORY — DX: Essential (primary) hypertension: I10

## 2022-08-14 LAB — COMPREHENSIVE METABOLIC PANEL
ALT: 89 U/L — ABNORMAL HIGH (ref 0–44)
AST: 57 U/L — ABNORMAL HIGH (ref 15–41)
Albumin: 3.7 g/dL (ref 3.5–5.0)
Alkaline Phosphatase: 74 U/L (ref 38–126)
Anion gap: 11 (ref 5–15)
BUN: 13 mg/dL (ref 6–20)
CO2: 23 mmol/L (ref 22–32)
Calcium: 8.9 mg/dL (ref 8.9–10.3)
Chloride: 100 mmol/L (ref 98–111)
Creatinine, Ser: 0.9 mg/dL (ref 0.61–1.24)
GFR, Estimated: >60 mL/min (ref >60)
Glucose, Bld: 278 mg/dL — ABNORMAL HIGH (ref 70–99)
Potassium: 4.4 mmol/L (ref 3.5–5.1)
Sodium: 134 mmol/L — ABNORMAL LOW (ref 135–145)
Total Bilirubin: 1.4 mg/dL — ABNORMAL HIGH (ref 0.3–1.2)
Total Protein: 6.3 g/dL — ABNORMAL LOW (ref 6.5–8.1)

## 2022-08-14 LAB — RESP PANEL BY RT-PCR (RSV, FLU A&B, COVID)  RVPGX2
Influenza A by PCR: NEGATIVE
Influenza B by PCR: NEGATIVE
Resp Syncytial Virus by PCR: NEGATIVE
SARS Coronavirus 2 by RT PCR: POSITIVE — AB

## 2022-08-14 LAB — CBC
HCT: 45.6 % (ref 39.0–52.0)
Hemoglobin: 14.1 g/dL (ref 13.0–17.0)
MCH: 25.9 pg — ABNORMAL LOW (ref 26.0–34.0)
MCHC: 30.9 g/dL (ref 30.0–36.0)
MCV: 83.7 fL (ref 80.0–100.0)
Platelets: 200 10*3/uL (ref 150–400)
RBC: 5.45 MIL/uL (ref 4.22–5.81)
RDW: 14 % (ref 11.5–15.5)
WBC: 9.6 10*3/uL (ref 4.0–10.5)
nRBC: 0 % (ref 0.0–0.2)

## 2022-08-14 LAB — ECHOCARDIOGRAM COMPLETE
AR max vel: 2.15 cm2
AV Area VTI: 2.09 cm2
AV Area mean vel: 2.01 cm2
AV Mean grad: 4 mmHg
AV Peak grad: 7 mmHg
Ao pk vel: 1.32 m/s
Height: 68 in
S' Lateral: 3.2 cm
Weight: 3040 oz

## 2022-08-14 LAB — GLUCOSE, CAPILLARY: Glucose-Capillary: 314 mg/dL — ABNORMAL HIGH (ref 70–99)

## 2022-08-14 LAB — TROPONIN I (HIGH SENSITIVITY)
Troponin I (High Sensitivity): 81 ng/L — ABNORMAL HIGH (ref <18)
Troponin I (High Sensitivity): 93 ng/L — ABNORMAL HIGH (ref <18)

## 2022-08-14 LAB — CBG MONITORING, ED
Glucose-Capillary: 265 mg/dL — ABNORMAL HIGH (ref 70–99)
Glucose-Capillary: 338 mg/dL — ABNORMAL HIGH (ref 70–99)

## 2022-08-14 LAB — BRAIN NATRIURETIC PEPTIDE: B Natriuretic Peptide: 591.3 pg/mL — ABNORMAL HIGH (ref 0.0–100.0)

## 2022-08-14 LAB — MRSA NEXT GEN BY PCR, NASAL: MRSA by PCR Next Gen: NOT DETECTED

## 2022-08-14 MED ORDER — LISINOPRIL 10 MG PO TABS
5.0000 mg | ORAL_TABLET | Freq: Every day | ORAL | Status: DC
  Administered 2022-08-14 – 2022-08-15 (×2): 5 mg via ORAL
  Filled 2022-08-14 (×2): qty 1

## 2022-08-14 MED ORDER — ACETAMINOPHEN 325 MG PO TABS: 650.0000 mg | ORAL_TABLET | Freq: Four times a day (QID) | ORAL | Status: DC | PRN

## 2022-08-14 MED ORDER — INSULIN ASPART 100 UNIT/ML IJ SOLN
0.0000 [IU] | Freq: Three times a day (TID) | INTRAMUSCULAR | Status: DC
  Administered 2022-08-14: 11 [IU] via SUBCUTANEOUS
  Administered 2022-08-15: 8 [IU] via SUBCUTANEOUS
  Administered 2022-08-15: 2 [IU] via SUBCUTANEOUS
  Administered 2022-08-15: 5 [IU] via SUBCUTANEOUS
  Administered 2022-08-16: 3 [IU] via SUBCUTANEOUS
  Administered 2022-08-16: 5 [IU] via SUBCUTANEOUS

## 2022-08-14 MED ORDER — SODIUM CHLORIDE 0.45 % IV SOLN: INTRAVENOUS | Status: DC

## 2022-08-14 MED ORDER — ALBUTEROL SULFATE HFA 108 (90 BASE) MCG/ACT IN AERS
2.0000 | INHALATION_SPRAY | Freq: Four times a day (QID) | RESPIRATORY_TRACT | Status: DC
  Administered 2022-08-14 – 2022-08-15 (×6): 2 via RESPIRATORY_TRACT
  Filled 2022-08-14: qty 6.7

## 2022-08-14 MED ORDER — ZOLPIDEM TARTRATE 5 MG PO TABS: 5.0000 mg | ORAL_TABLET | Freq: Every evening | ORAL | Status: DC | PRN

## 2022-08-14 MED ORDER — INSULIN ASPART 100 UNIT/ML IJ SOLN
0.0000 [IU] | Freq: Every day | INTRAMUSCULAR | Status: DC
  Administered 2022-08-14: 4 [IU] via SUBCUTANEOUS
  Administered 2022-08-15: 2 [IU] via SUBCUTANEOUS

## 2022-08-14 MED ORDER — ENOXAPARIN SODIUM 40 MG/0.4ML IJ SOSY
40.0000 mg | PREFILLED_SYRINGE | INTRAMUSCULAR | Status: DC
  Administered 2022-08-14 – 2022-08-15 (×2): 40 mg via SUBCUTANEOUS
  Filled 2022-08-14 (×2): qty 0.4

## 2022-08-14 MED ORDER — FUROSEMIDE 10 MG/ML IJ SOLN
40.0000 mg | Freq: Once | INTRAMUSCULAR | Status: AC
  Administered 2022-08-14: 40 mg via INTRAVENOUS
  Filled 2022-08-14: qty 4

## 2022-08-14 MED ORDER — ASPIRIN 81 MG PO TBEC
81.0000 mg | DELAYED_RELEASE_TABLET | Freq: Every day | ORAL | Status: DC
  Administered 2022-08-14 – 2022-08-16 (×3): 81 mg via ORAL
  Filled 2022-08-14 (×3): qty 1

## 2022-08-14 MED ORDER — SODIUM CHLORIDE 0.9 % IV SOLN
100.0000 mg | Freq: Every day | INTRAVENOUS | Status: DC
  Administered 2022-08-15: 100 mg via INTRAVENOUS
  Filled 2022-08-14: qty 20

## 2022-08-14 MED ORDER — ACETAMINOPHEN 650 MG RE SUPP: 650.0000 mg | Freq: Four times a day (QID) | RECTAL | Status: DC | PRN

## 2022-08-14 MED ORDER — SODIUM CHLORIDE 0.9 % IV SOLN
200.0000 mg | Freq: Once | INTRAVENOUS | Status: AC
  Administered 2022-08-14: 200 mg via INTRAVENOUS
  Filled 2022-08-14: qty 40

## 2022-08-14 MED ORDER — DAPAGLIFLOZIN PROPANEDIOL 10 MG PO TABS
10.0000 mg | ORAL_TABLET | Freq: Every day | ORAL | Status: DC
  Administered 2022-08-15 – 2022-08-16 (×2): 10 mg via ORAL
  Filled 2022-08-14 (×2): qty 1

## 2022-08-14 MED ORDER — FUROSEMIDE 10 MG/ML IJ SOLN
40.0000 mg | Freq: Four times a day (QID) | INTRAMUSCULAR | Status: AC
  Administered 2022-08-14 – 2022-08-15 (×3): 40 mg via INTRAVENOUS
  Filled 2022-08-14 (×3): qty 4

## 2022-08-14 MED ORDER — HYDROCODONE-ACETAMINOPHEN 5-325 MG PO TABS: 1.0000 | ORAL_TABLET | ORAL | Status: DC | PRN

## 2022-08-14 NOTE — ED Notes (Signed)
Got patient on the monitor did EKG shown to er provider patient is resting with call bell in reach

## 2022-08-14 NOTE — Inpatient Diabetes Management (Signed)
Inpatient Diabetes Program Recommendations  AACE/ADA: New Consensus Statement on Inpatient Glycemic Control (2015)  Target Ranges:  Prepandial:   less than 140 mg/dL      Peak postprandial:   less than 180 mg/dL (1-2 hours)      Critically ill patients:  140 - 180 mg/dL   Lab Results  Component Value Date   GLUCAP 265 (H) 08/14/2022   HGBA1C 6.8 (H) 08/02/2018    Review of Glycemic Control  Diabetes history: DM 2 Outpatient Diabetes medications: Glipizide 2.5 mg Daily, Metformin 1000 mg bid, Januvia 100 mg Daily  Current orders for Inpatient glycemic control:  None being evaluated in the ED  Inpatient Diabetes Program Recommendations:    If admitted: -  A1c level determining glucose control over the past 2-3 months -  Add Novolog 0-15 units tid + hs (Q4 if NPO in the ED)  Thanks,  Tama Headings RN, MSN, BC-ADM Inpatient Diabetes Coordinator Team Pager [phone] (8a-5p)

## 2022-08-14 NOTE — H&P (Signed)
History and Physical    Michael Shannon [id number] DOB: 1976-04-30 DOA: 08/14/2022  DOS: the patient was seen and examined on 08/14/2022  PCP: Michael Macadam, MD   Patient coming from: Home  I have personally briefly reviewed patient's old medical records in [place]  Michael Shannon, a 47 y/o with h/o HTN, DM, low back pain who has not been taking medication for up to 2 years.He presents with weakness, shortness of breath, and persistent cold symptoms for 3 weeks, getting worse over the last several days, to his primary care provider. He also endorses increased leg swelling. He denies chest pain, productive cough,feverl Due to his worsening symptoms he was referred by PCP to the ED for continued evaluation.    ED Course: T 98.3  161/105  HR 104  RR 20 . Patient appeared SOB but in no distress.Lab- glucose 278, AST 57  ALT 89  T. Bili 14, CBC nl diff 64/28/8  BNP 591.3  Troponin 81. CXR with pulmonary edema. In ED patient received IV lasix. TRH called to admit for continued management of CHF, DM, Covid 19  Review of Systems:  Review of Systems  Constitutional:  Positive for malaise/fatigue. Negative for chills and fever.  HENT: Negative.    Eyes: Negative.   Respiratory:  Positive for cough and shortness of breath. Negative for sputum production and wheezing.   Cardiovascular:  Positive for orthopnea and leg swelling. Negative for chest pain and palpitations.  Gastrointestinal: Negative.   Genitourinary: Negative.   Musculoskeletal: Negative.   Skin: Negative.   Neurological: Negative.   Endo/Heme/Allergies: Negative.   Psychiatric/Behavioral: Negative.      Past Medical History:  Diagnosis Date   Diabetes mellitus without complication ([place])    Hypertension    Pain     Past Surgical History:  Procedure Laterality Date   digestive surgery  1978   FINGER SURGERY Left 2007   middle finger    Soc Hx - married 25 years. Two daughters, 1 son. 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 Lives with wife, two daughters at home   reports that he has never smoked. He has never used smokeless tobacco. He reports that he does not currently use alcohol. He reports that he does not use drugs.  Allergies  Allergen Reactions   Penicillins Other (See Comments)    Unsure of reaction.    Family History  Problem Relation Age of Onset   Diabetes Mother    Hypertension Mother    Other Father        overdose   Diabetes Maternal Grandfather    Hypertension Maternal Grandfather     Prior to Admission medications   Medication Sig Start Date End Date Taking? Authorizing Provider  aspirin 81 MG tablet Take 81 mg by mouth daily.    [provider]  Cholecalciferol (VITAMIN D3 PO) Take 3,000 Units by mouth daily.    [provider]  DULoxetine (CYMBALTA) 60 MG capsule TAKE 1 CAPSULE BY MOUTH EVERY DAY 10/24/18   Marcial Pacas, MD  glipiZIDE (GLUCOTROL XL) 2.5 MG 24 hr tablet Take 2.5 mg by mouth daily.    [provider]  lisinopril (PRINIVIL,ZESTRIL) 5 MG tablet Take 5 mg by mouth daily. 06/02/18   [provider]  metFORMIN (GLUCOPHAGE) 1000 MG tablet Take 1,000 mg by mouth 2 (two) times daily.    [provider]  sitaGLIPtin (JANUVIA) 100 MG tablet Take 100 mg by mouth daily.    [provider]  Physical Exam: Vitals:   08/14/22 1011 08/14/22 1013 08/14/22 1115 08/14/22 1408  BP:   (!) 161/105   Pulse:  (!) 103 (!) 104   Resp:  (!) 28 20   Temp:    98.5 F (36.9 C)  TempSrc:    Oral  SpO2: 93% 93% 94%   Weight: 86.2 kg     Height: '5\' 8"'$  (1.727 m)       Physical Exam Constitutional:      General: He is not in acute distress.    Appearance: He is well-developed and normal weight. He is not ill-appearing.  HENT:     Head: Normocephalic and atraumatic.     Mouth/Throat:     Mouth: Mucous membranes are moist.  Eyes:     Extraocular Movements: Extraocular movements intact.     Pupils: Pupils are equal, round,  and reactive to light.  Neck:     Thyroid: No thyromegaly.     Vascular: No JVD.  Cardiovascular:     Rate and Rhythm: Regular rhythm. Tachycardia present.     Heart sounds: No murmur heard. Pulmonary:     Effort: Pulmonary effort is normal. No tachypnea, accessory muscle usage or respiratory distress.     Breath sounds: Examination of the right-lower field reveals rales. Examination of the left-lower field reveals rales. Rales present. No decreased breath sounds, wheezing or rhonchi.  Chest:     Chest wall: No mass or tenderness.  Abdominal:     General: Bowel sounds are normal.     Palpations: Abdomen is soft. There is no hepatomegaly or splenomegaly.     Tenderness: There is no abdominal tenderness.  Musculoskeletal:     Cervical back: Normal range of motion and neck supple.     Right lower leg: Edema present.     Left lower leg: Edema present.     Comments: 1+ pedal edema to ankles  Neurological:     General: No focal deficit present.     Mental Status: He is alert and oriented to person, place, and time.     Cranial Nerves: No cranial nerve deficit.  Psychiatric:        Mood and Affect: Mood normal.        Behavior: Behavior normal.      Labs on Admission: I have personally reviewed following labs and imaging studies  CBC: Recent Labs  Lab 08/14/22 1025  WBC 9.6  HGB 14.1  HCT 45.6  MCV 83.7  PLT [id number]   Basic Metabolic Panel: Recent Labs  Lab 08/14/22 1025  NA 134*  K 4.4  CL 100  CO2 23  GLUCOSE 278*  BUN 13  CREATININE 0.90  CALCIUM 8.9   GFR: Estimated Creatinine Clearance: 109.5 mL/min (by C-G formula based on SCr of 0.9 mg/dL). Liver Function Tests: Recent Labs  Lab 08/14/22 1025  AST 57*  ALT 89*  ALKPHOS 74  BILITOT 1.4*  PROT 6.3*  ALBUMIN 3.7   No results for input(s): "LIPASE", "AMYLASE" in the last 168 hours. No results for input(s): "AMMONIA" in the last 168 hours. Coagulation Profile: No results for input(s): "INR", "PROTIME" in  the last 168 hours. Cardiac Enzymes: No results for input(s): "CKTOTAL", "CKMB", "CKMBINDEX", "TROPONINI" in the last 168 hours. BNP (last 3 results) No results for input(s): "PROBNP" in the last 8760 hours. HbA1C: No results for input(s): "HGBA1C" in the last 72 hours. CBG: Recent Labs  Lab 08/14/22 1125  GLUCAP 265*   Lipid Profile: No  results for input(s): "CHOL", "HDL", "LDLCALC", "TRIG", "CHOLHDL", "LDLDIRECT" in the last 72 hours. Thyroid Function Tests: No results for input(s): "TSH", "T4TOTAL", "FREET4", "T3FREE", "THYROIDAB" in the last 72 hours. Anemia Panel: No results for input(s): "VITAMINB12", "FOLATE", "FERRITIN", "TIBC", "IRON", "RETICCTPCT" in the last 72 hours. Urine analysis: No results found for: "COLORURINE", "APPEARANCEUR", "LABSPEC", "PHURINE", "GLUCOSEU", "HGBUR", "BILIRUBINUR", "KETONESUR", "PROTEINUR", "UROBILINOGEN", "NITRITE", "LEUKOCYTESUR"  Radiological Exams on Admission: I have personally reviewed images DG Chest Port 1 View  Result Date: 08/14/2022 CLINICAL DATA:  Shortness of breath EXAM: PORTABLE CHEST - 1 VIEW COMPARISON:  None available FINDINGS: Heart size within normal limits. Mild to moderate bilateral pulmonary vascular congestion. Small bilateral pleural effusions with mild bibasilar opacities which is likely a combination of pulmonary edema and atelectasis. IMPRESSION: Radiographic findings most consistent with CHF/fluid volume overload. Electronically Signed   By: Miachel Roux M.D.   On: 08/14/2022 10:45    EKG: I have personally reviewed EKG: NSR, prolonged QT, LAE, old inferior/anteroseptal injury  Assessment/Plan Principal Problem:   Acute hypoxemic respiratory failure due to COVID-19 [facility]) Active Problems:   CHF (congestive heart failure), NYHA class II (HCC)   Diabetes (HCC)   HTN (hypertension)    Assessment and Plan: * Acute hypoxemic respiratory failure due to COVID-19 [facility]) Patient covid 19 positive with SOB, hypoxemia and  pulmonary edema on chest x-ray  Plan Cardiac tele admit  Covid protocol and consult for remdesivir  CHF (congestive heart failure), NYHA class II ([place]) Patient with untreated HTN now with SOB, in setting of Covid 19, peripheral edema, pulmonary edema on CXR and mildly elevated BNP. IN ED received lasix IV  Plan Cardiac tele admit  2 D echo  Continue IV lasix x 3 doses  Continue ACE  Add Farxiga 10 mg daily  CHF protocol  HTN (hypertension) Long standing problem. No medication for 2 years. BP now elevated.  Plan Diuretics  Lisinopril    Diabetes (HCC) Serum glucose 278 w/o elevated AG. Taking no meds. Last A1C several years ago >7.  Plan Farxiga 10 mg daily  Sliding scale coverage.        DVT prophylaxis: Lovenox Code Status: Full Code Family Communication: wife present - understands Dx and Tx plan. Adivsed to be tested for covid  Disposition Plan: home when medically stable  Consults called: none  Admission status: Inpatient, Telemetry bed   Adella Hare, MD [facility] 08/14/2022, 2:45 PM

## 2022-08-14 NOTE — Progress Notes (Signed)
Heart Failure Nurse Navigator Progress Note  Following this hospitalization to assess for HV TOC readiness. Currently in ED, pending further testing at this time to determine if any CHF involvement.   BNP 591 BLE mild pitting edema.  Use of dayquil + nyquil for past several days.  BP elevated 150-160s/ 100s Tachycardic 100-110s, on room air.  Pending ECHO.  + COVID this admission.    Will continue to follow this admission to determine HV TOC need.    Pricilla Holm, MSN, RN Heart Failure Nurse Navigator

## 2022-08-14 NOTE — ED Notes (Signed)
House coverage notified about the long delay to get that bed cleaned  over 3 hours now

## 2022-08-14 NOTE — Assessment & Plan Note (Signed)
Patient covid 19 positive with SOB, hypoxemia and pulmonary edema on chest x-ray  Plan Cardiac tele admit  Covid protocol and consult for remdesivir

## 2022-08-14 NOTE — ED Notes (Signed)
ED TO INPATIENT HANDOFF REPORT  ED Nurse Name and Phone #: [phone]  S Name/Age/Gender Michael Shannon 47 y.o. male Room/Bed: 027C/027C  Code Status   Code Status: Full Code  Home/SNF/Other Home Patient oriented to: self, place, time, and situation Is this baseline? Yes   Triage Complete: Triage complete  Chief Complaint Acute hypoxemic respiratory failure due to COVID-19 ([place]) [U07.1, J96.01]  Triage Note Pt bib ems from [facility] physicians, sent for further evaluation of pedal edema, htn, hyperglycemia, and sob; cold hx 3 weeks ago, lingering nonproductive cough since; 3 days exertional dyspnea, orthopnea;off dm /htn meds x 2 years after prescription ran out; new pedal edema, crackles lower lobes; hypertensive 164/102, cbg 311, Hr 106 w/ pvcs, 99% RA; 18 ga LFA, 12 lead sinus tach; denies pain   Allergies Allergies  Allergen Reactions   Penicillins Other (See Comments)    Unsure of reaction.    Level of Care/Admitting Diagnosis ED Disposition     ED Disposition  Admit   Condition  --   Level Green: [place] [[id number]]  Level of Care: Progressive [102]  Admit to Progressive based on following criteria: RESPIRATORY PROBLEMS hypoxemic/hypercapnic respiratory failure that is responsive to NIPPV (BiPAP) or High Flow Nasal Cannula (6-80 lpm). Frequent assessment/intervention, no > Q2 hrs < Q4 hrs, to maintain oxygenation and pulmonary hygiene.  Admit to Progressive based on following criteria: CARDIOVASCULAR & THORACIC of moderate stability with acute coronary syndrome symptoms/low risk myocardial infarction/hypertensive urgency/arrhythmias/heart failure potentially compromising stability and stable post cardiovascular intervention patients.  May admit patient to [facility] or [facility] if equivalent level of care is available:: Yes  Covid Evaluation: Confirmed COVID Positive  Diagnosis: Acute hypoxemic respiratory failure due to COVID-19  [facility] [id number]  Admitting Physician: Neena Rhymes [[id number]]  Attending Physician: Neena Rhymes [id number]  Certification:: I certify this patient will need inpatient services for at least 2 midnights  Estimated Length of Stay: 7          B Medical/Surgery History Past Medical History:  Diagnosis Date   Diabetes mellitus without complication ([place])    Hypertension    Pain    Past Surgical History:  Procedure Laterality Date   digestive surgery  1978   FINGER SURGERY Left 2007   middle finger     A IV Location/Drains/Wounds Patient Lines/Drains/Airways Status     Active Line/Drains/Airways     Name Placement date Placement time Site Days   Peripheral IV 08/14/22 18 G Anterior;Left;Proximal Forearm 08/14/22  1255  Forearm  less than 1            Intake/Output Last 24 hours  Intake/Output Summary (Last 24 hours) at 08/14/2022 1739 Last data filed at 08/14/2022 1337 Gross per 24 hour  Intake --  Output 625 ml  Net -625 ml    Labs/Imaging Results for orders placed or performed during the hospital encounter of 08/14/22 (from the past 48 hour(s))  Resp panel by RT-PCR (RSV, Flu A&B, Covid) Anterior Nasal Swab     Status: Abnormal   Collection Time: 08/14/22 10:22 AM   Specimen: Anterior Nasal Swab  Result Value Ref Range   SARS Coronavirus 2 by RT PCR POSITIVE (A) NEGATIVE   Influenza A by PCR NEGATIVE NEGATIVE   Influenza B by PCR NEGATIVE NEGATIVE    Comment: (NOTE) The Xpert Xpress SARS-CoV-2/FLU/RSV plus assay is intended as an aid in the diagnosis of influenza from Nasopharyngeal swab specimens and should not be  used as a sole basis for treatment. Nasal washings and aspirates are unacceptable for Xpert Xpress SARS-CoV-2/FLU/RSV testing.  Fact Sheet for Patients: EntrepreneurPulse.com.au  Fact Sheet for Healthcare Providers: IncredibleEmployment.be  This test is not yet approved or cleared by the [place]  FDA and has been authorized for detection and/or diagnosis of SARS-CoV-2 by FDA under an Emergency Use Authorization (EUA). This EUA will remain in effect (meaning this test can be used) for the duration of the COVID-19 declaration under Section 564(b)(1) of the Act, 21 U.S.C. section 360bbb-3(b)(1), unless the authorization is terminated or revoked.     Resp Syncytial Virus by PCR NEGATIVE NEGATIVE    Comment: (NOTE) Fact Sheet for Patients: EntrepreneurPulse.com.au  Fact Sheet for Healthcare Providers: IncredibleEmployment.be  This test is not yet approved or cleared by the [place] FDA and has been authorized for detection and/or diagnosis of SARS-CoV-2 by FDA under an Emergency Use Authorization (EUA). This EUA will remain in effect (meaning this test can be used) for the duration of the COVID-19 declaration under Section 564(b)(1) of the Act, 21 U.S.C. section 360bbb-3(b)(1), unless the authorization is terminated or revoked.  Performed at [facility], [place] [address], [place], [place] [address]   Comprehensive metabolic panel     Status: Abnormal   Collection Time: 08/14/22 10:25 AM  Result Value Ref Range   Sodium 134 (L) 135 - 145 mmol/L   Potassium 4.4 3.5 - 5.1 mmol/L   Chloride 100 98 - 111 mmol/L   CO2 23 22 - 32 mmol/L   Glucose, Bld 278 (H) 70 - 99 mg/dL    Comment: Glucose reference range applies only to samples taken after fasting for at least 8 hours.   BUN 13 6 - 20 mg/dL   Creatinine, Ser 0.90 0.61 - 1.24 mg/dL   Calcium 8.9 8.9 - 10.3 mg/dL   Total Protein 6.3 (L) 6.5 - 8.1 g/dL   Albumin 3.7 3.5 - 5.0 g/dL   AST 57 (H) 15 - 41 U/L   ALT 89 (H) 0 - 44 U/L   Alkaline Phosphatase 74 38 - 126 U/L   Total Bilirubin 1.4 (H) 0.3 - 1.2 mg/dL   GFR, Estimated >60 >60 mL/min    Comment: (NOTE) Calculated using the CKD-EPI Creatinine Equation (2021)    Anion gap 11 5 - 15    Comment: Performed at [place] [address] [place], [place] [address]  CBC     Status: Abnormal   Collection Time: 08/14/22 10:25 AM  Result Value Ref Range   WBC 9.6 4.0 - 10.5 K/uL   RBC 5.45 4.22 - 5.81 MIL/uL   Hemoglobin 14.1 13.0 - 17.0 g/dL   HCT 45.6 39.0 - 52.0 %   MCV 83.7 80.0 - 100.0 fL   MCH 25.9 (L) 26.0 - 34.0 pg   MCHC 30.9 30.0 - 36.0 g/dL   RDW 14.0 11.5 - 15.5 %   Platelets 200 150 - 400 K/uL   nRBC 0.0 0.0 - 0.2 %    Comment: Performed at [facility], [place] [address], [place], [place] [address]  Troponin I (High Sensitivity)     Status: Abnormal   Collection Time: 08/14/22 10:25 AM  Result Value Ref Range   Troponin I (High Sensitivity) 81 (H) <18 ng/L    Comment: (NOTE) Elevated high sensitivity troponin I (hsTnI) values and significant  changes across serial measurements may suggest ACS but many other  chronic and  acute conditions are known to elevate hsTnI results.  Refer to the "Links" section for chest pain algorithms and additional  guidance. Performed at [facility], [place] [address] [place], [place] [address]   Brain natriuretic peptide     Status: Abnormal   Collection Time: 08/14/22 10:25 AM  Result Value Ref Range   B Natriuretic Peptide 591.3 (H) 0.0 - 100.0 pg/mL    Comment: Performed at [place] [address], [place], [place] [address]  POC CBG, ED     Status: Abnormal   Collection Time: 08/14/22 11:25 AM  Result Value Ref Range   Glucose-Capillary 265 (H) 70 - 99 mg/dL    Comment: Glucose reference range applies only to samples taken after fasting for at least 8 hours.  Troponin I (High Sensitivity)     Status: Abnormal   Collection Time: 08/14/22  1:01 PM  Result Value Ref Range   Troponin I (High Sensitivity) 93 (H) <18 ng/L    Comment: (NOTE) Elevated high sensitivity troponin I (hsTnI) values and significant  changes across serial measurements may suggest ACS but many other  chronic and acute conditions are known to elevate  hsTnI results.  Refer to the "Links" section for chest pain algorithms and additional  guidance. Performed at [facility], [place] [address] [place], [place] [address]   CBG monitoring, ED     Status: Abnormal   Collection Time: 08/14/22  4:53 PM  Result Value Ref Range   Glucose-Capillary 338 (H) 70 - 99 mg/dL    Comment: Glucose reference range applies only to samples taken after fasting for at least 8 hours.   DG Chest Port 1 View  Result Date: 08/14/2022 CLINICAL DATA:  Shortness of breath EXAM: PORTABLE CHEST - 1 VIEW COMPARISON:  None available FINDINGS: Heart size within normal limits. Mild to moderate bilateral pulmonary vascular congestion. Small bilateral pleural effusions with mild bibasilar opacities which is likely a combination of pulmonary edema and atelectasis. IMPRESSION: Radiographic findings most consistent with CHF/fluid volume overload. Electronically Signed   By: Miachel Roux M.D.   On: 08/14/2022 10:45    Pending Labs Unresulted Labs (From admission, onward)     Start     Ordered   08/21/22 0500  Creatinine, serum  (enoxaparin (LOVENOX)    CrCl >/= 30 ml/min)  Weekly,   R     Comments: while on enoxaparin therapy    08/14/22 1405   08/15/22 0500  Comprehensive metabolic panel  Tomorrow morning,   R        08/14/22 1405   08/14/22 1404  Ferritin  Add-on,   AD        08/14/22 1405   08/14/22 1404  Fibrinogen  Add-on,   AD        08/14/22 1405   08/14/22 1404  Procalcitonin  Add-on,   AD       References:    Procalcitonin Lower Respiratory Tract Infection AND Sepsis Procalcitonin Algorithm   08/14/22 1405   08/14/22 1400  HIV Antibody (routine testing w rflx)  (HIV Antibody (Routine testing w reflex) panel)  Add-on,   AD        08/14/22 1405   08/14/22 1359  Hemoglobin A1c  (Glycemic Control (SSI)  Q 4 Hours / Glycemic Control (SSI)  AC +/- HS)  Add-on,   AD       Comments: To assess prior glycemic control    08/14/22 1405  Vitals/Pain Today's Vitals   08/14/22 1102 08/14/22 1115 08/14/22 1408 08/14/22 1630  BP:  (!) 161/105  (!) 142/93  Pulse:  (!) 104  (!) 106  Resp:  20  20  Temp:   98.5 F (36.9 C)   TempSrc:   Oral   SpO2:  94%  92%  Weight:      Height:      PainSc: 0-No pain       Isolation Precautions Airborne and Contact precautions  Medications Medications  aspirin EC tablet 81 mg (81 mg Oral Given 08/14/22 1717)  lisinopril (ZESTRIL) tablet 5 mg (5 mg Oral Given 08/14/22 1717)  furosemide (LASIX) injection 40 mg (40 mg Intravenous Given 08/14/22 1720)  dapagliflozin propanediol (FARXIGA) tablet 10 mg (has no administration in time range)  insulin aspart (novoLOG) injection 0-15 Units (11 Units Subcutaneous Given 08/14/22 1738)  enoxaparin (LOVENOX) injection 40 mg (has no administration in time range)  0.45 % sodium chloride infusion (has no administration in time range)  acetaminophen (TYLENOL) tablet 650 mg (has no administration in time range)    Or  acetaminophen (TYLENOL) suppository 650 mg (has no administration in time range)  HYDROcodone-acetaminophen (NORCO/VICODIN) 5-325 MG per tablet 1-2 tablet (has no administration in time range)  zolpidem (AMBIEN) tablet 5 mg (has no administration in time range)  remdesivir 200 mg in sodium chloride 0.9% 250 mL IVPB (200 mg Intravenous New Bag/Given 08/14/22 1731)    Followed by  remdesivir 100 mg in sodium chloride 0.9 % 100 mL IVPB (has no administration in time range)  albuterol (VENTOLIN HFA) 108 (90 Base) MCG/ACT inhaler 2 puff (2 puffs Inhalation Given 08/14/22 1723)  furosemide (LASIX) injection 40 mg (40 mg Intravenous Given 08/14/22 1258)    Mobility walks     Focused Assessments Pulmonary Assessment Handoff:  Lung sounds: Bilateral Breath Sounds: Diminished, Clear O2 Device: Room Air      R Recommendations: See Admitting Provider Note  Report given to:   Additional Notes: pt ic covid positive receive remdesivir   and he is on lasix

## 2022-08-14 NOTE — ED Triage Notes (Signed)
Pt bib ems from [facility] physicians, sent for further evaluation of pedal edema, htn, hyperglycemia, and sob; cold hx 3 weeks ago, lingering nonproductive cough since; 3 days exertional dyspnea, orthopnea;off dm /htn meds x 2 years after prescription ran out; new pedal edema, crackles lower lobes; hypertensive 164/102, cbg 311, Hr 106 w/ pvcs, 99% RA; 18 ga LFA, 12 lead sinus tach; denies pain

## 2022-08-14 NOTE — ED Provider Notes (Signed)
Michael Shannon Provider Note   CSN: [id number] Arrival date & time: 08/14/22  1003     History HTN,DM non compliant with medicaiton Chief Complaint  Patient presents with   Shortness of Breath    Michael Shannon is a 47 y.o. male.  47 y.o male with a PMH of DM, HTN, presents to the ED via EMS from [facility] physicians office. Patient was evaluated at PCP office today just for.  Patient reports he has had a cold approximately 3 weeks ago, since then he is continue to have a nonproductive cough.  For the past 3 days he has began to have some dyspnea on exertion, reports that when he wakes up in the morning he feels like he is very short of breath however his symptoms usually resolve by from 9 AM in the morning.  He does have a prior history of diabetes, however reports he stopped taking medication approximately 2 years ago, began to change his diet but has not checked in with a primary care physician.  He also reports noticing his legs to be more swollen than usual.  He has been taking DayQuil and NyQuil for his symptoms without any improvement.  No prior history of CHF, no prior history of CAD, no other complaints reported.  The history is provided by the patient and medical records.  Shortness of Breath Associated symptoms: cough   Associated symptoms: no abdominal pain, no chest pain, no fever, no headaches, no sore throat and no vomiting        Home Medications Prior to Admission medications   Medication Sig Start Date End Date Taking? Authorizing Provider  aspirin 81 MG tablet Take 81 mg by mouth daily.    [provider]  Cholecalciferol (VITAMIN D3 PO) Take 3,000 Units by mouth daily.    [provider]  DULoxetine (CYMBALTA) 60 MG capsule TAKE 1 CAPSULE BY MOUTH EVERY DAY 10/24/18   Marcial Pacas, MD  glipiZIDE (GLUCOTROL XL) 2.5 MG 24 hr tablet Take 2.5 mg by mouth daily.    [provider]  lisinopril  (PRINIVIL,ZESTRIL) 5 MG tablet Take 5 mg by mouth daily. 06/02/18   [provider]  metFORMIN (GLUCOPHAGE) 1000 MG tablet Take 1,000 mg by mouth 2 (two) times daily.    [provider]  sitaGLIPtin (JANUVIA) 100 MG tablet Take 100 mg by mouth daily.    [provider]      Allergies    Penicillins    Review of Systems   Review of Systems  Constitutional:  Negative for chills and fever.  HENT:  Negative for sore throat.   Respiratory:  Positive for cough and shortness of breath.   Cardiovascular:  Negative for chest pain.  Gastrointestinal:  Negative for abdominal pain, diarrhea, nausea and vomiting.  Genitourinary:  Negative for flank pain.  Musculoskeletal:  Negative for back pain.  Neurological:  Negative for light-headedness and headaches.  All other systems reviewed and are negative.   Physical Exam Updated Vital Signs BP (!) 161/105   Pulse (!) 104   Temp 98.5 F (36.9 C)   Resp 20   Ht '5\' 8"'$  (1.727 m)   Wt 86.2 kg   SpO2 94%   BMI 28.89 kg/m  Physical Exam Vitals reviewed.  Constitutional:      Appearance: He is well-developed.  HENT:     Head: Normocephalic and atraumatic.  Cardiovascular:     Rate and Rhythm: Tachycardia present.  Comments: Mild pitting edema bilaterally. Pulmonary:     Effort: Pulmonary effort is normal. No tachypnea.     Breath sounds: Examination of the right-lower field reveals rales. Examination of the left-lower field reveals rales. Rales present.  Abdominal:     Palpations: Abdomen is soft.     Tenderness: There is no abdominal tenderness.  Musculoskeletal:     Cervical back: Normal range of motion and neck supple.     Right lower leg: Edema present.     Left lower leg: Edema present.  Skin:    General: Skin is warm and dry.  Neurological:     Mental Status: He is alert and oriented to person, place, and time.     ED Results / Procedures / Treatments   Labs (all labs ordered are listed, but  only abnormal results are displayed) Labs Reviewed  RESP PANEL BY RT-PCR (RSV, FLU A&B, COVID)  RVPGX2 - Abnormal; Notable for the following components:      Result Value   SARS Coronavirus 2 by RT PCR POSITIVE (*)    All other components within normal limits  COMPREHENSIVE METABOLIC PANEL - Abnormal; Notable for the following components:   Sodium 134 (*)    Glucose, Bld 278 (*)    Total Protein 6.3 (*)    AST 57 (*)    ALT 89 (*)    Total Bilirubin 1.4 (*)    All other components within normal limits  CBC - Abnormal; Notable for the following components:   MCH 25.9 (*)    All other components within normal limits  BRAIN NATRIURETIC PEPTIDE - Abnormal; Notable for the following components:   B Natriuretic Peptide 591.3 (*)    All other components within normal limits  CBG MONITORING, ED - Abnormal; Notable for the following components:   Glucose-Capillary 265 (*)    All other components within normal limits  TROPONIN I (HIGH SENSITIVITY) - Abnormal; Notable for the following components:   Troponin I (High Sensitivity) 81 (*)    All other components within normal limits  TROPONIN I (HIGH SENSITIVITY)    EKG EKG Interpretation  Date/Time:  Friday August 14 2022 10:10:46 EST Ventricular Rate:  105 PR Interval:  191 QRS Duration: 103 QT Interval:  387 QTC Calculation: 512 R Axis:   217 Text Interpretation: Sinus tachycardia Probable left atrial enlargement Inferior infarct, old Anterolateral infarct, age indeterminate Prolonged QT interval No old tracing to compare Confirmed by Aletta Edouard [phone]) on 08/14/2022 10:33:35 AM  Radiology DG Chest Port 1 View  Result Date: 08/14/2022 CLINICAL DATA:  Shortness of breath EXAM: PORTABLE CHEST - 1 VIEW COMPARISON:  None available FINDINGS: Heart size within normal limits. Mild to moderate bilateral pulmonary vascular congestion. Small bilateral pleural effusions with mild bibasilar opacities which is likely a combination of  pulmonary edema and atelectasis. IMPRESSION: Radiographic findings most consistent with CHF/fluid volume overload. Electronically Signed   By: Miachel Roux M.D.   On: 08/14/2022 10:45    Procedures Procedures    Medications Ordered in ED Medications  furosemide (LASIX) injection 40 mg (has no administration in time range)    ED Course/ Medical Decision Making/ A&P Clinical Course as of 08/14/22 1247  Fri Aug 14, 2022  1246 SARS Coronavirus 2 by RT PCR(!): POSITIVE [JS]    Clinical Course User Index [JS] Janeece Fitting, PA-C  Medical Decision Making Amount and/or Complexity of Data Reviewed Labs: ordered. Decision-making details documented in ED Course. Radiology: ordered.  Risk Prescription drug management. Decision regarding hospitalization.    This patient presents to the ED for concern of sob, this involves a number of treatment options, and is a complaint that carries with it a high risk of complications and morbidity.  The differential diagnosis includes exacerbation of heart failure, infection, versus ACS.   Co morbidities: Discussed in HPI   Brief History:  Patient presents here from PCPs office after his checkup today and ongoing symptoms of orthopnea, noncompliant with his diabetic medication along with his high blood pressure medication.  Does have shortness of breath, exacerbated in the mornings but improves by midday.  EMR reviewed including pt PMHx, past surgical history and past visits to ER.   See HPI for more details   Lab Tests:  I ordered and independently interpreted labs.  The pertinent results include:    Labs notable for CBC with no leukocytosis, hemoglobin is within normal limits.  CMP remarkable for glucose of 278, he does have a history of diabetes but has not taken any insulin or medication in over 2 years.  LFTs are slightly elevated but he is without any abdominal pain or nausea or vomiting.  BNP is 591, no prior  history of CHF but suspect orthopnea coming from this.  First troponin is 81 however not having any chest pain at this time.   Imaging Studies:  NAD. I personally reviewed all imaging studies and no acute abnormality found. I agree with radiology interpretation.  Cardiac Monitoring:  The patient was maintained on a cardiac monitor.  I personally viewed and interpreted the cardiac monitored which showed an underlying rhythm of: NSR EKG non-ischemic   Medicines ordered:  I ordered medication including lasix  for diuresis Reevaluation of the patient after these medicines showed that the patient stayed the same I have reviewed the patients home medicines and have made adjustments as needed  Reevaluation:  After the interventions noted above I re-evaluated patient and found that they have :stayed the same   Social Determinants of Health:  The patient's social determinants of health were a factor in the care of this patient  Problem List / ED Course:  Patient with a prior history of diabetes, hypertension noncompliant with medication went to [facility] PCP this morning for a checkup.  Was sent here due to hyperglycemia, hypertensive along with orthopnea.  Reports that he had a cold approximately 3 weeks ago, his symptoms somewhat improved however over the last 3 days he is experiencing difficult shortness of breath all of this is worsened with exertion.  Reports the shortness of breath usually goes away throughout the day but is worse when he first wakes up.  He has a nonproductive cough, he has not been running any fevers and O2 saturation on arrival was low at 94%.  He reports he stopped taking his blood pressure medication along with his glucose glucose control approximately 2 years for unknown reasons.  Labs on today showed a elevated BNP at 591.3, mild pitting edema on exam, close to bilateral bases, no fever but continue cough I suspect CHF versus pneumonia.  CBC with no leukocytosis.  CMP  is within normal limits.  Creatinine levels within normal limits.  LFTs slightly elevated however here without any abdominal pain or nausea.  Troponin is elevated at 81, no prior history of CAD and without any chest pain today will obtain delta.  Chest x-ray is concerning for pulmonary edema.  Vitals continue to be hypertensive however unsure how long he has been hypertensive for. Given Lasix to help with diuresis along with high blood pressure.  I do feel that patient needs admission into the hospital for further evaluation of a new onset of heart failure.  I discussed this with patient along with significant other at the bedside who are agreeable to plan and treatment. His respiratory panel is positive for COVID-19.  Dispostion:  After consideration of the diagnostic results and the patients response to treatment, I feel that the patent would benefit from mission for further evaluation of hyperglycemia, hypertension, along with acute exacerbation of heart failure.   12:32 PM Spoke to Dr. Linda Hedges who will admit patient for further management.   Portions of this note were generated with [employer]. Dictation errors may occur despite best attempts at proofreading.   Final Clinical Impression(s) / ED Diagnoses Final diagnoses:  SOB (shortness of breath)  Hyperglycemia    Rx / DC Orders ED Discharge Orders     None         Janeece Fitting, PA-C 08/14/22 [place], MD 08/15/22 1010

## 2022-08-14 NOTE — Subjective & Objective (Signed)
Mr. Forest, a 47 y/o with h/o HTN, DM, low back pain who has not been taking medication for up to 2 years.He presents with weakness, shortness of breath, and persistent cold symptoms for 3 weeks, getting worse over the last several days, to his primary care provider. He also endorses increased leg swelling. He denies chest pain, productive cough,feverl Due to his worsening symptoms he was referred by PCP to the ED for continued evaluation.

## 2022-08-14 NOTE — Assessment & Plan Note (Signed)
Patient with untreated HTN now with SOB, in setting of Covid 19, peripheral edema, pulmonary edema on CXR and mildly elevated BNP. IN ED received lasix IV  Plan Cardiac tele admit  2 D echo  Continue IV lasix x 3 doses  Continue ACE  Add Farxiga 10 mg daily  CHF protocol

## 2022-08-14 NOTE — Assessment & Plan Note (Signed)
Uncontrolled T2Dm with hyperglycemia. Hgb A1c 11.2   Continue with metformin  Possible SGLT 2 inh as outpatient.  Diabetic diet teaching done in the hospital. He does have a glucometer at home per his report.  Follow up as outpatient.

## 2022-08-14 NOTE — Assessment & Plan Note (Addendum)
At the time of his discharge his blood pressure systolic has been 126 to 127 mmHg. Plan to continue with losartan and spironolactone.

## 2022-08-14 NOTE — Progress Notes (Signed)
\  Echocardiogram 2D Echocardiogram has been performed.  Michael Shannon 08/14/2022, 5:04 PM

## 2022-08-15 DIAGNOSIS — I10 Essential (primary) hypertension: Secondary | ICD-10-CM | POA: Diagnosis not present

## 2022-08-15 DIAGNOSIS — U071 COVID-19: Secondary | ICD-10-CM | POA: Diagnosis not present

## 2022-08-15 DIAGNOSIS — I5033 Acute on chronic diastolic (congestive) heart failure: Secondary | ICD-10-CM | POA: Diagnosis not present

## 2022-08-15 DIAGNOSIS — M545 Low back pain, unspecified: Secondary | ICD-10-CM | POA: Diagnosis not present

## 2022-08-15 LAB — GLUCOSE, CAPILLARY
Glucose-Capillary: 252 mg/dL — ABNORMAL HIGH (ref 70–99)
Glucose-Capillary: 213 mg/dL — ABNORMAL HIGH (ref 70–99)
Glucose-Capillary: 136 mg/dL — ABNORMAL HIGH (ref 70–99)
Glucose-Capillary: 204 mg/dL — ABNORMAL HIGH (ref 70–99)

## 2022-08-15 LAB — COMPREHENSIVE METABOLIC PANEL
ALT: 76 U/L — ABNORMAL HIGH (ref 0–44)
AST: 38 U/L (ref 15–41)
Albumin: 3.6 g/dL (ref 3.5–5.0)
Alkaline Phosphatase: 74 U/L (ref 38–126)
Anion gap: 10 (ref 5–15)
BUN: 14 mg/dL (ref 6–20)
CO2: 29 mmol/L (ref 22–32)
Calcium: 8.8 mg/dL — ABNORMAL LOW (ref 8.9–10.3)
Chloride: 95 mmol/L — ABNORMAL LOW (ref 98–111)
Creatinine, Ser: 1.13 mg/dL (ref 0.61–1.24)
GFR, Estimated: >60 mL/min (ref >60)
Glucose, Bld: 300 mg/dL — ABNORMAL HIGH (ref 70–99)
Potassium: 3.6 mmol/L (ref 3.5–5.1)
Sodium: 134 mmol/L — ABNORMAL LOW (ref 135–145)
Total Bilirubin: 0.7 mg/dL (ref 0.3–1.2)
Total Protein: 6.4 g/dL — ABNORMAL LOW (ref 6.5–8.1)

## 2022-08-15 LAB — HEMOGLOBIN A1C
Hgb A1c MFr Bld: 11.2 % — ABNORMAL HIGH (ref 4.8–5.6)
Mean Plasma Glucose: 274.74 mg/dL

## 2022-08-15 LAB — MAGNESIUM: Magnesium: 1.8 mg/dL (ref 1.7–2.4)

## 2022-08-15 MED ORDER — POTASSIUM CHLORIDE CRYS ER 20 MEQ PO TBCR
40.0000 meq | EXTENDED_RELEASE_TABLET | Freq: Once | ORAL | Status: AC
  Administered 2022-08-15: 40 meq via ORAL
  Filled 2022-08-15: qty 2

## 2022-08-15 MED ORDER — LIVING BETTER WITH HEART FAILURE BOOK: Freq: Once | Status: AC

## 2022-08-15 MED ORDER — LOSARTAN POTASSIUM 25 MG PO TABS
25.0000 mg | ORAL_TABLET | Freq: Every day | ORAL | Status: DC
  Administered 2022-08-16: 25 mg via ORAL
  Filled 2022-08-15: qty 1

## 2022-08-15 MED ORDER — FUROSEMIDE 20 MG PO TABS
20.0000 mg | ORAL_TABLET | Freq: Every day | ORAL | Status: DC
  Administered 2022-08-15 – 2022-08-16 (×2): 20 mg via ORAL
  Filled 2022-08-15 (×2): qty 1

## 2022-08-15 MED ORDER — POTASSIUM CHLORIDE CRYS ER 20 MEQ PO TBCR
40.0000 meq | EXTENDED_RELEASE_TABLET | Freq: Two times a day (BID) | ORAL | Status: AC
  Administered 2022-08-15 (×2): 40 meq via ORAL
  Filled 2022-08-15 (×2): qty 2

## 2022-08-15 MED ORDER — METFORMIN HCL 500 MG PO TABS
500.0000 mg | ORAL_TABLET | Freq: Every day | ORAL | Status: DC
  Administered 2022-08-15 – 2022-08-16 (×2): 500 mg via ORAL
  Filled 2022-08-15 (×2): qty 1

## 2022-08-15 MED ORDER — SPIRONOLACTONE 12.5 MG HALF TABLET
12.5000 mg | ORAL_TABLET | Freq: Every day | ORAL | Status: DC
  Administered 2022-08-15 – 2022-08-16 (×2): 12.5 mg via ORAL
  Filled 2022-08-15 (×2): qty 1

## 2022-08-15 MED ORDER — ALBUTEROL SULFATE HFA 108 (90 BASE) MCG/ACT IN AERS
2.0000 | INHALATION_SPRAY | Freq: Three times a day (TID) | RESPIRATORY_TRACT | Status: DC
  Administered 2022-08-16: 2 via RESPIRATORY_TRACT
  Filled 2022-08-15: qty 6.7

## 2022-08-15 NOTE — Assessment & Plan Note (Signed)
Echocardiogram with preserved LV systolic function with EF 55 to 60%, moderate LVH, RV systolic function is preserved. LA with mild dilatation, RVSP normal. No significant valvular disease.   Urine output documented  at [id number] ml Systolic blood pressure 0000000 to 138 mmHg.  Patient with improved volume status.  Plan to transition to oral loop diuretic in am. Add spironolactone and SGLT 2 inh Change ace inh to arb.  Continue blood pressure monitoring.

## 2022-08-15 NOTE — Progress Notes (Signed)
Progress Note   Patient: Michael Shannon [id number] DOB: 1975/10/11 DOA: 08/14/2022     1 DOS: the patient was seen and examined on 08/15/2022   Brief hospital course: [name] was admitted to the hospital with the working diagnosis of heart failure decompensation.   47 yo male with the past medical history of hypertension, T2DM, and chronic low back pain who presented with dyspnea, weakness and cough for 3 weeks. Positive leg edema. He was evaluated by his primary care provider and he was referred to the ED for further evaluation. On his initial physical examination his blood pressure was 161/105, HR 104, RR 20 and 02 saturation 93%, lungs with rales bilaterally, heart with S1 and S2 present and rhythmic, tachycardic, abdomen with no distention and positive lower extremity edema.   Na 134, K 4,4 CL 100 bicarbonate 23, glucose 278 bun 13 cr 0,90  AST 57 ALT 89  BNP 591  High sensitive troponin 81 and 93  Wbc 9,6 hgb 14.1 plt 200  Sars covid 19 positive   Chest radiograph with cardiomegaly with bilateral hilar vascular congestion, bilateral interstitial infiltrates and bilateral small pleural effusions.   EKG 105 bpm, right axis deviation, qtc 512, right bundle branch block, sinus rhythm with right atrial enlargement, q wave lead II, III, AvF, V2 and V3, no ST segment changes, negative T wave V5 and V6.   Patient was placed on furosemide and redmdesivir.    Assessment and Plan: * COVID-19 virus infection No clinical signs of viral pneumonia, onset of symptoms about 3 weeks ago. Discontinue remdesivir.  Continue with droplet precautions per protocol.   Acute on chronic diastolic CHF (congestive heart failure) (HCC) Echocardiogram with preserved LV systolic function with EF 55 to 60%, moderate LVH, RV systolic function is preserved. LA with mild dilatation, RVSP normal. No significant valvular disease.   Urine output documented  at [id number] ml Systolic blood pressure 0000000 to 138  mmHg.  Patient with improved volume status.  Plan to transition to oral loop diuretic in am. Add spironolactone and SGLT 2 inh Change ace inh to arb.  Continue blood pressure monitoring.   HTN (hypertension) Continue blood pressure control with ARB, spironolactone and loop diuretic.   T2DM (type 2 diabetes mellitus) ([place]) Uncontrolled T2Dm with hyperglycemia. Fasting glucose today 300 mg/dl Start patient on metformin and SGLT 2 inh. Insulin sliding scale during his hospitalization.    Low back pain Currently controlled with oral analgesics.         Subjective: Patient with improvement in dyspnea, no chest pain, no cough, edema has resolved.   Physical Exam: Vitals:   08/15/22 0423 08/15/22 0430 08/15/22 0805 08/15/22 0906  BP: (!) 149/100     Pulse: (!) 103   (!) 106  Resp: 17   18  Temp: 98.3 F (36.8 C)  98.7 F (37.1 C)   TempSrc: Oral  Oral   SpO2: 96%   98%  Weight:  82.5 kg    Height:       Neurology awake and alert ENT with mild pallor Cardiovascular with S1 and S2 present and rhythmic with no gallops, rubs or murmurs No JVD No lower extremity edema Respiratory with no rales or wheezing, no rhonchi Abdomen with no distention  Data Reviewed:    Family Communication: I spoke with patient's wife at the bedside, we talked in detail about patient's condition, plan of care and prognosis and all questions were addressed.   Disposition: Status is: Inpatient Remains inpatient appropriate  because: heart failure   Planned Discharge Destination: Home possible discharge home tomorrow.    Author: Tawni Millers, MD 08/15/2022 9:53 AM  For on call review [email or web address].

## 2022-08-15 NOTE — Progress Notes (Signed)
Nutrition Education Note  RD consulted for nutrition education regarding CHF and diabetes.  Lab Results  Component Value Date   HGBA1C 11.2 (H) 08/15/2022   PTA DM medications are 2.5 mg glipizide daily, 1000 mg metformin BID, and 100 mg sitagliptin daily.   Labs reviewed: Na: 134, CBGS: 252  (inpatient orders for glycemic control are 500 mg metformin daily, 0-15 units insulin aspart TID with meals and 0-5 units insulin aspart daily at bedtime).    Per MD notes, pt with new diagnosis of CHF.   Reviewed I/O's: -1.9 L since admission. Suspect wt loss during admission related to diuresis.   Pt unavailable at time of visit. Attempted to speak with pt via call to hospital room phone, however, unable to reach. RD unable to obtain further nutrition-related history or complete nutrition-focused physical exam at this time.    RD provided "Heart Healthy, Consistent Carbohydrate Nutrition Therapy" handout from the Academy of [employer] and Dietetics. Attached to AVS/ discharge summary.   RD also referred pt to [facility] for further support and reinforcement at discharge.   Given persistent hyperglycemia and elevated Hgb A1c, suspect insulin regimen is inadequate. RD paged DM coordinator for further recommendations.   Medications reviewed and include farxiga, lasix, potassium chloride, and aldactone.   Current diet order is heart healthy/ carb modified, patient is consuming approximately n/a% of meals at this time. Labs and medications reviewed. No further nutrition interventions warranted at this time. RD contact information provided. If additional nutrition issues arise, please re-consult RD.   Loistine Chance, RD, LDN, [place] Registered Dietitian II Certified Diabetes Care and Education Specialist Please refer to [facility] for RD and/or RD on-call/weekend/after hours pager

## 2022-08-15 NOTE — Assessment & Plan Note (Signed)
Remained well controlled during his hospitalization.

## 2022-08-15 NOTE — Discharge Instructions (Signed)

## 2022-08-15 NOTE — Hospital Course (Addendum)
Michael Shannon was admitted to the hospital with the working diagnosis of heart failure decompensation.   47 yo male with the past medical history of hypertension, T2DM, and chronic low back pain who presented with dyspnea, weakness and cough for 3 weeks. Over the last week he developed worsening lower extremity edema, dyspnea and orthopnea. He was evaluated by his primary care provider and he was referred to the ED for further evaluation. On his initial physical examination his blood pressure was 161/105, HR 104, RR 20 and 02 saturation 93%, lungs with rales bilaterally, heart with S1 and S2 present and rhythmic, tachycardic, abdomen with no distention and positive lower extremity edema.   Na 134, K 4,4 CL 100 bicarbonate 23, glucose 278 bun 13 cr 0,90  AST 57 ALT 89  BNP 591  High sensitive troponin 81 and 93  Wbc 9,6 hgb 14.1 plt 200  Sars covid 19 positive   Chest radiograph with cardiomegaly with bilateral hilar vascular congestion, bilateral interstitial infiltrates and bilateral small pleural effusions.   EKG 105 bpm, right axis deviation, qtc 512, right bundle branch block, sinus rhythm with right atrial enlargement, q wave lead II, III, AvF, V2 and V3, no ST segment changes, negative T wave V5 and V6.   Patient was placed on furosemide and redmdesivir.   02/25 his blood pressure and volume status has improved.  No clinical signs of active COVID 19 viral disease.  Plan to follow up as outpatient.

## 2022-08-16 DIAGNOSIS — I5033 Acute on chronic diastolic (congestive) heart failure: Secondary | ICD-10-CM | POA: Diagnosis not present

## 2022-08-16 DIAGNOSIS — E1159 Type 2 diabetes mellitus with other circulatory complications: Secondary | ICD-10-CM

## 2022-08-16 DIAGNOSIS — U071 COVID-19: Secondary | ICD-10-CM | POA: Diagnosis not present

## 2022-08-16 DIAGNOSIS — I10 Essential (primary) hypertension: Secondary | ICD-10-CM | POA: Diagnosis not present

## 2022-08-16 LAB — BASIC METABOLIC PANEL
Anion gap: 9 (ref 5–15)
BUN: 13 mg/dL (ref 6–20)
CO2: 28 mmol/L (ref 22–32)
Calcium: 8.6 mg/dL — ABNORMAL LOW (ref 8.9–10.3)
Chloride: 98 mmol/L (ref 98–111)
Creatinine, Ser: 1.18 mg/dL (ref 0.61–1.24)
GFR, Estimated: >60 mL/min (ref >60)
Glucose, Bld: 199 mg/dL — ABNORMAL HIGH (ref 70–99)
Potassium: 4 mmol/L (ref 3.5–5.1)
Sodium: 135 mmol/L (ref 135–145)

## 2022-08-16 LAB — LIPID PANEL
Cholesterol: 111 mg/dL (ref 0–200)
HDL: 48 mg/dL (ref >40)
LDL Cholesterol: 52 mg/dL (ref 0–99)
Total CHOL/HDL Ratio: 2.3 RATIO
Triglycerides: 55 mg/dL (ref <150)
VLDL: 11 mg/dL (ref 0–40)

## 2022-08-16 LAB — GLUCOSE, CAPILLARY
Glucose-Capillary: 170 mg/dL — ABNORMAL HIGH (ref 70–99)
Glucose-Capillary: 201 mg/dL — ABNORMAL HIGH (ref 70–99)

## 2022-08-16 LAB — MAGNESIUM: Magnesium: 1.7 mg/dL (ref 1.7–2.4)

## 2022-08-16 MED ORDER — METFORMIN HCL 500 MG PO TABS: 500.0000 mg | ORAL_TABLET | Freq: Every day | ORAL | 0 refills | Status: DC

## 2022-08-16 MED ORDER — LOSARTAN POTASSIUM 25 MG PO TABS: 25.0000 mg | ORAL_TABLET | Freq: Every day | ORAL | 0 refills | Status: DC

## 2022-08-16 MED ORDER — EMPAGLIFLOZIN 10 MG PO TABS: 10.0000 mg | ORAL_TABLET | Freq: Every day | ORAL | Status: DC

## 2022-08-16 MED ORDER — FUROSEMIDE 20 MG PO TABS: 20.0000 mg | ORAL_TABLET | Freq: Every day | ORAL | 0 refills | Status: DC | PRN

## 2022-08-16 MED ORDER — ALBUTEROL SULFATE HFA 108 (90 BASE) MCG/ACT IN AERS: 2.0000 | INHALATION_SPRAY | RESPIRATORY_TRACT | Status: DC | PRN

## 2022-08-16 MED ORDER — EMPAGLIFLOZIN 10 MG PO TABS: 10.0000 mg | ORAL_TABLET | Freq: Every day | ORAL | 0 refills | Status: DC

## 2022-08-16 MED ORDER — SPIRONOLACTONE 25 MG PO TABS: 12.5000 mg | ORAL_TABLET | Freq: Every day | ORAL | 0 refills | Status: DC

## 2022-08-16 NOTE — Discharge Summary (Signed)
Physician Discharge Summary   Patient: Michael Shannon MRN: [id number] DOB: 08-04-1975  Admit date:     08/14/2022  Discharge date: 08/16/22  Discharge Physician: Michael Shannon   PCP: Michael Macadam, MD   Recommendations at discharge:    Patient has been placed on heart failure and hypertension medical regimen with losartan, spironolactone. Holding on SGLT 2 inh for now due to high cost, may need pre authorization that can bee done as outpatient.  As needed furosemide for volume overload, 2 to 3 lbs weight gain in 24 hrs or 5 lbs in 7 days.  Started on metformin for diabetes mellitus and he received diabetic diet teaching.   Discharge Diagnoses: Principal Problem:   COVID-19 virus infection Active Problems:   Acute on chronic diastolic CHF (congestive heart failure) (HCC)   HTN (hypertension)   T2DM (type 2 diabetes mellitus) (HCC)   Low back pain  Resolved Problems:   * No resolved hospital problems. [facility] Course: Michael Shannon was admitted to the hospital with the working diagnosis of heart failure decompensation.   47 yo male with the past medical history of hypertension, T2DM, and chronic low back pain who presented with dyspnea, weakness and cough for 3 weeks. Over the last week he developed worsening lower extremity edema, dyspnea and orthopnea. He was evaluated by his primary care provider and he was referred to the ED for further evaluation. On his initial physical examination his blood pressure was 161/105, HR 104, RR 20 and 02 saturation 93%, lungs with rales bilaterally, heart with S1 and S2 present and rhythmic, tachycardic, abdomen with no distention and positive lower extremity edema.   Na 134, K 4,4 CL 100 bicarbonate 23, glucose 278 bun 13 cr 0,90  AST 57 ALT 89  BNP 591  High sensitive troponin 81 and 93  Wbc 9,6 hgb 14.1 plt 200  Sars covid 19 positive   Chest radiograph with cardiomegaly with bilateral hilar vascular congestion, bilateral  interstitial infiltrates and bilateral small pleural effusions.   EKG 105 bpm, right axis deviation, qtc 512, right bundle branch block, sinus rhythm with right atrial enlargement, q wave lead II, III, AvF, V2 and V3, no ST segment changes, negative T wave V5 and V6.   Patient was placed on furosemide and redmdesivir.   02/25 his blood pressure and volume status has improved.  No clinical signs of active COVID 19 viral disease.  Plan to follow up as outpatient.    Assessment and Plan: * Acute on chronic diastolic CHF (congestive heart failure) (HCC) Echocardiogram with preserved LV systolic function with EF 55 to 60%, moderate LVH, RV systolic function is preserved. LA with mild dilatation, RVSP normal. No significant valvular disease.   Patient was placed on IV furosemide for diuresis, negative fluid balance was achieved, - 3,643 ml, with significant improvement in his symptoms.   Patient will continue heart failure management with losartan, spironolactone. Possible SGLT 2 inh as outpatient. As needed furosemide.  Possible starting B blocker as outpatient.   COVID-19 virus infection No clinical signs of viral pneumonia, onset of symptoms about 3 weeks ago. No indication for antiviral therapy or steroids at this point.   HTN (hypertension) At the time of his discharge his blood pressure systolic has been [id number] to [id number] mmHg. Plan to continue with losartan and spironolactone.   T2DM (type 2 diabetes mellitus) ([place]) Uncontrolled T2Dm with hyperglycemia. Hgb A1c 11.2   Continue with metformin  Possible SGLT 2 inh as outpatient.  Diabetic diet teaching done in the hospital. He does have a glucometer at home per his report.  Follow up as outpatient.    Low back pain Remained well controlled during his hospitalization.          Consultants: none  Procedures performed: none   Disposition: Home Diet recommendation:  Cardiac and Carb modified diet DISCHARGE  MEDICATION: Allergies as of 08/16/2022       Reactions   Penicillins Other (See Comments)   Unsure of reaction.        Medication List     STOP taking these medications    NYQUIL PO   ROBITUSSIN 12 HOUR COUGH PO       TAKE these medications    furosemide 20 MG tablet Commonly known as: LASIX Take 1 tablet (20 mg total) by mouth daily as needed for edema or fluid (take in case of weigh gain 2 to 3 lbs in 24 hrs or 5 lbs in 7 days,).   losartan 25 MG tablet Commonly known as: COZAAR Take 1 tablet (25 mg total) by mouth daily.   metFORMIN 500 MG tablet Commonly known as: GLUCOPHAGE Take 1 tablet (500 mg total) by mouth daily with breakfast. Start taking on: August 17, 2022   spironolactone 25 MG tablet Commonly known as: ALDACTONE Take 0.5 tablets (12.5 mg total) by mouth daily. Start taking on: August 17, 2022        Discharge Exam: [name] Weights   08/14/22 2004 08/15/22 0430 08/16/22 0500  Weight: 82.8 kg 82.5 kg 78.6 kg   BP 127/88 (BP Location: Right Arm)   Pulse (!) 104   Temp 98.6 F (37 C) (Oral)   Resp 13   Ht '5\' 8"'$  (1.727 m)   Wt 78.6 kg   SpO2 96%   BMI 26.35 kg/m   Patient is feeling better, no chest pain, no cough or dyspnea.   Neurology awake and alert ENT with mild pallor Cardiovascular with S1 and S2 present and rhythmic, with no gallops, rubs or murmurs Respiratory with no rales or wheezing Abdomen with no distention  No lower extremity edema   Condition at discharge: stable  The results of significant diagnostics from this hospitalization (including imaging, microbiology, ancillary and laboratory) are listed below for reference.   Imaging Studies: ECHOCARDIOGRAM COMPLETE  Result Date: 08/14/2022    ECHOCARDIOGRAM REPORT   Patient Name:   Michael Shannon Date of Exam: 08/14/2022 Medical Rec #:  [id number]      Height:       68.0 in Accession #:    [id number]     Weight:       190.0 lb Date of Birth:  October 15, 1975     BSA:           2.000 m Patient Age:    47 years       BP:           153/99 mmHg Patient Gender: M              HR:           105 bpm. Exam Location:  Inpatient Procedure: 2D Echo, Cardiac Doppler and Color Doppler Indications:    CHF-Acute Systolic [id number]  History:        Patient has no prior history of Echocardiogram examinations.  Sonographer:    Ronny Flurry Referring Phys: [place]  1. Left ventricular ejection fraction, by estimation, is 55 to 60%. The left ventricle has normal  function. The left ventricle has no regional wall motion abnormalities. There is moderate concentric left ventricular hypertrophy. Indeterminate diastolic filling due to E-A fusion.  2. Right ventricular systolic function is normal. The right ventricular size is normal.  3. Left atrial size was mildly dilated.  4. Moderate pleural effusion in the left lateral region.  5. The mitral valve is normal in structure. No evidence of mitral valve regurgitation.  6. The aortic valve is tricuspid. Aortic valve regurgitation is not visualized. No aortic stenosis is present.  7. The inferior vena cava is normal in size with greater than 50% respiratory variability, suggesting right atrial pressure of 3 mmHg. Comparison(s): No prior Echocardiogram. FINDINGS  Left Ventricle: Left ventricular ejection fraction, by estimation, is 55 to 60%. The left ventricle has normal function. The left ventricle has no regional wall motion abnormalities. The left ventricular internal cavity size was normal in size. There is  moderate concentric left ventricular hypertrophy. Indeterminate diastolic filling due to E-A fusion. Right Ventricle: The right ventricular size is normal. No increase in right ventricular wall thickness. Right ventricular systolic function is normal. Left Atrium: Left atrial size was mildly dilated. Right Atrium: Right atrial size was normal in size. Pericardium: There is no evidence of pericardial effusion. Mitral Valve: The  mitral valve is normal in structure. No evidence of mitral valve regurgitation. Tricuspid Valve: The tricuspid valve is normal in structure. Tricuspid valve regurgitation is trivial. Aortic Valve: The aortic valve is tricuspid. Aortic valve regurgitation is not visualized. No aortic stenosis is present. Aortic valve mean gradient measures 4.0 mmHg. Aortic valve peak gradient measures 7.0 mmHg. Aortic valve area, by VTI measures 2.09 cm. Pulmonic Valve: The pulmonic valve was normal in structure. Pulmonic valve regurgitation is trivial. Aorta: The aortic root is normal in size and structure. Venous: The inferior vena cava is normal in size with greater than 50% respiratory variability, suggesting right atrial pressure of 3 mmHg. IAS/Shunts: The interatrial septum is aneurysmal. The atrial septum is grossly normal. Additional Comments: There is a moderate pleural effusion in the left lateral region.  LEFT VENTRICLE PLAX 2D LVIDd:         4.30 cm LVIDs:         3.20 cm LV PW:         1.60 cm LV IVS:        1.00 cm LVOT diam:     2.00 cm LV SV:         37 LV SV Index:   19 LVOT Area:     3.14 cm  RIGHT VENTRICLE             IVC RV S prime:     19.20 cm/s  IVC diam: 1.60 cm TAPSE (M-mode): 2.2 cm LEFT ATRIUM             Index        RIGHT ATRIUM           Index LA diam:        4.30 cm 2.15 cm/m   RA Area:     13.40 cm LA Vol (A2C):   39.1 ml 19.55 ml/m  RA Volume:   30.50 ml  15.25 ml/m LA Vol (A4C):   63.4 ml 31.71 ml/m LA Biplane Vol: 50.9 ml 25.45 ml/m  AORTIC VALVE AV Area (Vmax):    2.15 cm AV Area (Vmean):   2.01 cm AV Area (VTI):     2.09 cm AV Vmax:  132.00 cm/s AV Vmean:          91.700 cm/s AV VTI:            0.178 m AV Peak Grad:      7.0 mmHg AV Mean Grad:      4.0 mmHg LVOT Vmax:         90.27 cm/s LVOT Vmean:        58.667 cm/s LVOT VTI:          0.118 m LVOT/AV VTI ratio: 0.66  AORTA Ao Root diam: 3.10 cm Ao Asc diam:  2.50 cm TRICUSPID VALVE TR Peak grad:   11.6 mmHg TR Vmax:         170.00 cm/s  SHUNTS Systemic VTI:  0.12 m Systemic Diam: 2.00 cm Gwyndolyn Kaufman MD Electronically signed by Gwyndolyn Kaufman MD Signature Date/Time: 08/14/2022/7:41:26 PM    Final    DG Chest Port 1 View  Result Date: 08/14/2022 CLINICAL DATA:  Shortness of breath EXAM: PORTABLE CHEST - 1 VIEW COMPARISON:  None available FINDINGS: Heart size within normal limits. Mild to moderate bilateral pulmonary vascular congestion. Small bilateral pleural effusions with mild bibasilar opacities which is likely a combination of pulmonary edema and atelectasis. IMPRESSION: Radiographic findings most consistent with CHF/fluid volume overload. Electronically Signed   By: Miachel Roux M.D.   On: 08/14/2022 10:45    Microbiology: Results for orders placed or performed during the hospital encounter of 08/14/22  Resp panel by RT-PCR (RSV, Flu A&B, Covid) Anterior Nasal Swab     Status: Abnormal   Collection Time: 08/14/22 10:22 AM   Specimen: Anterior Nasal Swab  Result Value Ref Range Status   SARS Coronavirus 2 by RT PCR POSITIVE (A) NEGATIVE Final   Influenza A by PCR NEGATIVE NEGATIVE Final   Influenza B by PCR NEGATIVE NEGATIVE Final    Comment: (NOTE) The Xpert Xpress SARS-CoV-2/FLU/RSV plus assay is intended as an aid in the diagnosis of influenza from Nasopharyngeal swab specimens and should not be used as a sole basis for treatment. Nasal washings and aspirates are unacceptable for Xpert Xpress SARS-CoV-2/FLU/RSV testing.  Fact Sheet for Patients: EntrepreneurPulse.com.au  Fact Sheet for Healthcare Providers: IncredibleEmployment.be  This test is not yet approved or cleared by the [place] FDA and has been authorized for detection and/or diagnosis of SARS-CoV-2 by FDA under an Emergency Use Authorization (EUA). This EUA will remain in effect (meaning this test can be used) for the duration of the COVID-19 declaration under Section 564(b)(1) of the Act,  21 U.S.C. section 360bbb-3(b)(1), unless the authorization is terminated or revoked.     Resp Syncytial Virus by PCR NEGATIVE NEGATIVE Final    Comment: (NOTE) Fact Sheet for Patients: EntrepreneurPulse.com.au  Fact Sheet for Healthcare Providers: IncredibleEmployment.be  This test is not yet approved or cleared by the [place] FDA and has been authorized for detection and/or diagnosis of SARS-CoV-2 by FDA under an Emergency Use Authorization (EUA). This EUA will remain in effect (meaning this test can be used) for the duration of the COVID-19 declaration under Section 564(b)(1) of the Act, 21 U.S.C. section 360bbb-3(b)(1), unless the authorization is terminated or revoked.  Performed at [facility], [place] [address] [place], [place] [address]   MRSA Next Gen by PCR, Nasal     Status: None   Collection Time: 08/14/22  8:09 PM   Specimen: Nasal Mucosa; Nasal Swab  Result Value Ref Range Status   MRSA by PCR Next Gen NOT  DETECTED NOT DETECTED Final    Comment: (NOTE) The GeneXpert MRSA Assay (FDA approved for NASAL specimens only), is one component of a comprehensive MRSA colonization surveillance program. It is not intended to diagnose MRSA infection nor to guide or monitor treatment for MRSA infections. Test performance is not FDA approved in patients less than 77 years old. Performed at [facility], [place] [address] [place], [place] [address]     Labs: CBC: Recent Labs  Lab 08/14/22 1025  WBC 9.6  HGB 14.1  HCT 45.6  MCV 83.7  PLT [id number]   Basic Metabolic Panel: Recent Labs  Lab 08/14/22 1025 08/15/22 0031 08/16/22 0017  NA 134* 134* 135  K 4.4 3.6 4.0  CL 100 95* 98  CO2 '23 29 28  '$ GLUCOSE 278* 300* 199*  BUN '13 14 13  '$ CREATININE 0.90 1.13 1.18  CALCIUM 8.9 8.8* 8.6*  MG  --  1.8 1.7   Liver Function Tests: Recent Labs  Lab 08/14/22 1025 08/15/22 0031  AST 57* 38  ALT 89* 76*  ALKPHOS 74 74   BILITOT 1.4* 0.7  PROT 6.3* 6.4*  ALBUMIN 3.7 3.6   CBG: Recent Labs  Lab 08/15/22 0624 08/15/22 1135 08/15/22 1621 08/15/22 2147 08/16/22 0638  GLUCAP 252* 213* 136* 204* 170*    Discharge time spent: greater than 30 minutes.  Signed: Tawni Millers, MD [facility] 08/16/2022

## 2022-08-16 NOTE — Progress Notes (Signed)
Nsg Discharge Note  Admit Date:  08/14/2022 Discharge date: 08/16/2022   Michael Shannon to be D/C'd Home per MD order.  AVS completed.   Patient/caregiver able to verbalize understanding.  Discharge Medication:   Discharge Assessment: Vitals:   08/16/22 0815 08/16/22 1149  BP:  (!) 137/97  Pulse:  100  Resp:  15  Temp:  98.3 F (36.8 C)  SpO2: 96% 97%   Skin clean, dry and intact without evidence of skin break down, no evidence of skin tears noted. IV catheter discontinued intact. Site without signs and symptoms of complications - no redness or edema noted at insertion site, patient denies c/o pain - only slight tenderness at site.  Dressing with slight pressure applied.  D/c Instructions-Education: Discharge instructions given to patient/family with verbalized understanding. D/c education completed with patient/family including follow up instructions, medication list, d/c activities limitations if indicated, with other d/c instructions as indicated by MD - patient able to verbalize understanding, all questions fully answered. Patient instructed to return to ED, call 911, or call MD for any changes in condition.  Patient escorted via [place], and D/C home via private auto.  Hollace Michelli, [name], RN 08/16/2022 2:48 PM

## 2022-08-16 NOTE — Plan of Care (Signed)
  Problem: Health Behavior/Discharge Planning: Goal: Ability to manage health-related needs will improve Outcome: Adequate for Discharge   

## 2022-08-20 DIAGNOSIS — R42 Dizziness and giddiness: Secondary | ICD-10-CM | POA: Diagnosis not present

## 2022-08-20 DIAGNOSIS — I5032 Chronic diastolic (congestive) heart failure: Secondary | ICD-10-CM | POA: Diagnosis not present

## 2022-08-20 DIAGNOSIS — E1165 Type 2 diabetes mellitus with hyperglycemia: Secondary | ICD-10-CM | POA: Diagnosis not present

## 2022-08-20 DIAGNOSIS — I10 Essential (primary) hypertension: Secondary | ICD-10-CM | POA: Diagnosis not present

## 2022-09-03 DIAGNOSIS — I5032 Chronic diastolic (congestive) heart failure: Secondary | ICD-10-CM | POA: Diagnosis not present

## 2022-09-03 DIAGNOSIS — I959 Hypotension, unspecified: Secondary | ICD-10-CM | POA: Diagnosis not present

## 2022-09-03 DIAGNOSIS — E1165 Type 2 diabetes mellitus with hyperglycemia: Secondary | ICD-10-CM | POA: Diagnosis not present

## 2022-09-07 NOTE — Progress Notes (Unsigned)
Primary Physician/Referring:  Caren Macadam, MD  Patient ID: Michael Shannon, male    DOB: February 02, 1976, 47 y.o.   MRN: [id number]  No chief complaint on file.  HPI:    Michael Shannon  is a 47 y.o. male, with a PMH significant for but no limited to HTN, DM, and recently new diagnosis of diastolic heart failure, who presents today to establish care and cardiac evaluation.   Pt was recently discharged from the hospital on 08/16/2022. Of note patient had not been taking his medications for approx 2 years and presented to the ER with shortness of breath, and persistent cold symptoms for 3 weeks, getting worse over the last several days. He was noted be hypertensive and  have leg swelling, but no chest pain. He was found COVID 19 positive and diagnosed with Acute on chronic diastolic CHF on which was thought to be precipitated by untreated HTN. BNP 591, HS Troponin 81, 93.  Pt received lV lasix and improved remarkably. He was discharged on the follow medications: Furosemide 20 mg PRN, losartan 25 mg daily, metformin 500 mg daily, and spironolactone 12.5 mg daily. Holding on SGLT 2 inhibitor for now due to high cost, may need pre-authorization that can bee done as outpatient.  Pt followed up with PCP on 08/20/2022 and was lightheaded when he is moving around. His home BP readings were noted to be 89/65 ,94/67, 100/78. Therefore, his Losartan dose was changed to 12.5 mg daily.   Today ***  Past Medical History:  Diagnosis Date   Diabetes mellitus without complication ([place])    Hypertension    Pain    Past Surgical History:  Procedure Laterality Date   digestive surgery  1978   FINGER SURGERY Left 2007   middle finger   Family History  Problem Relation Age of Onset   Diabetes Mother    Hypertension Mother    Other Father        overdose   Diabetes Maternal Grandfather    Hypertension Maternal Grandfather     Social History   Tobacco Use   Smoking status: Never   Smokeless tobacco:  Never  Substance Use Topics   Alcohol use: Not Currently    Comment: occasionally - 1-2 drinks per month   Marital Status: Married  ROS  ***ROS Objective  There were no vitals taken for this visit. There is no height or weight on file to calculate BMI.     08/16/2022   11:49 AM 08/16/2022    8:08 AM 08/16/2022    6:25 AM  Vitals with BMI  Systolic 0000000 [id number]   Diastolic 97 88   Pulse 123XX123 104 95     ***Physical Exam  Medications and allergies   Allergies  Allergen Reactions   Penicillins Other (See Comments)    Unsure of reaction.     Medication list after today's encounter   Current Outpatient Medications:    furosemide (LASIX) 20 MG tablet, Take 1 tablet (20 mg total) by mouth daily as needed for edema or fluid (take in case of weigh gain 2 to 3 lbs in 24 hrs or 5 lbs in 7 days,)., Disp: 30 tablet, Rfl: 0   losartan (COZAAR) 25 MG tablet, Take 1 tablet (25 mg total) by mouth daily., Disp: 30 tablet, Rfl: 0   metFORMIN (GLUCOPHAGE) 500 MG tablet, Take 1 tablet (500 mg total) by mouth daily with breakfast., Disp: 30 tablet, Rfl: 0   spironolactone (ALDACTONE) 25 MG tablet, Take 0.5 tablets (  12.5 mg total) by mouth daily., Disp: 15 tablet, Rfl: 0  Laboratory examination:   Lab Results  Component Value Date   NA 135 08/16/2022   K 4.0 08/16/2022   CO2 28 08/16/2022   GLUCOSE 199 (H) 08/16/2022   BUN 13 08/16/2022   CREATININE 1.18 08/16/2022   CALCIUM 8.6 (L) 08/16/2022   GFRNONAA >60 08/16/2022       Latest Ref Rng & Units 08/16/2022   12:17 AM 08/15/2022   12:31 AM 08/14/2022   10:25 AM  CMP  Glucose 70 - 99 mg/dL 199  300  278   BUN 6 - 20 mg/dL 13  14  13    Creatinine 0.61 - 1.24 mg/dL 1.18  1.13  0.90   Sodium 135 - 145 mmol/L 135  134  134   Potassium 3.5 - 5.1 mmol/L 4.0  3.6  4.4   Chloride 98 - 111 mmol/L 98  95  100   CO2 22 - 32 mmol/L 28  29  23    Calcium 8.9 - 10.3 mg/dL 8.6  8.8  8.9   Total Protein 6.5 - 8.1 g/dL  6.4  6.3   Total Bilirubin 0.3 -  1.2 mg/dL  0.7  1.4   Alkaline Phos 38 - 126 U/L  74  74   AST 15 - 41 U/L  38  57   ALT 0 - 44 U/L  76  89       Latest Ref Rng & Units 08/14/2022   10:25 AM 08/02/2018    9:37 AM  CBC  WBC 4.0 - 10.5 K/uL 9.6  9.8   Hemoglobin 13.0 - 17.0 g/dL 14.1  13.1   Hematocrit 39.0 - 52.0 % 45.6  40.4   Platelets 150 - 400 K/uL 200      Lipid Panel Recent Labs    08/16/22 0017  CHOL 111  TRIG 55  LDLCALC 52  VLDL 11  HDL 48  CHOLHDL 2.3    HEMOGLOBIN A1C Lab Results  Component Value Date   HGBA1C 11.2 (H) 08/15/2022   MPG 274.74 08/15/2022   TSH No results for input(s): "TSH" in the last 8760 hours.  External labs:    Radiology:   08/14/2022 - DG Chest Xray Mild to moderate bilateral pulmonary vascular congestion. Small bilateral pleural effusions with mild bibasilar opacities which is likely a combination of pulmonary edema and atelectasis.  IMPRESSION: Radiographic findings most consistent with CHF/fluid volume overload.   Cardiac Studies:   08/14/2022 - Echocardiogram 1.Left ventricular ejection fraction, by estimation, is 55 to 60% . The left ventricle has normal function. The left ventricle has no regional wall motion abnormalities. There is moderate concentric left ventricular hypertrophy. Indeterminate diastolic filling due to E- A fusion.  2 Right ventricular systolic function is normal. The right ventricular size is normal.  3. Left atrial size was mildly dilated.  4. Moderate pleural effusion in the left lateral region.  5. The mitral valve is normal in structure. No evidence of mitral valve regurgitation. 6. The aortic valve is tricuspid. Aortic valve regurgitation is not visualized. No aortic stenosis is present. 7. The inferior vena cava is normal in size with greater than 50% respiratory variability, suggesting right atrial pressure of 3 mmHg.  EKG:    08/14/2022 Vent. Rate: 105 BPM PR interval: 191 ms QRS duration: 103 ms QT/QTcB: 387/512  ms P-R-T axes: 62 217 129 Interpretation: Sinus tachycardia, Probable left atrial enlargement inferior infarct, old Anterolateral infarct, age indeterminate,  Prolonged QT interval.  No old tracing to compare Confirmed by Aletta Edouard, MD  Assessment  No diagnosis found.   No orders of the defined types were placed in this encounter.   No orders of the defined types were placed in this encounter.   There are no discontinued medications.   Recommendations:   Michael Shannon  is a 47 y.o. male, with a PMH significant for but no limited to HTN, DM, and recently new diagnosis of diastolic heart failure, who presents today to establish care and cardiac evaluation.    Chronic Diastolic Heart Failure (HFpEF) Left ventricular ejection fraction, by estimation, is 55 to 60%.  NYHA Class *** Plan - Continue GDMT including losartan, spironolactone, (adding Toprol 12.5 ? / [place] v [place])  - Schedule repeat Echocardiogram  3 months  - Continue Lasix PRN for volume overload, 2 to 3 lbs weight gain in 24 hrs or 5 lbs in 7 days.  - Monitor daily weights and blood pressures at home - Monitor salt intake <2000 mg daily   Diabetes Mellitus Plan - On metformin, managed by PCP      Floydene Flock, DO, [facility]  09/07/2022, 8:17 PM Office: [phone] Pager: [phone]

## 2022-09-08 ENCOUNTER — Ambulatory Visit: Payer: BC Managed Care – PPO | Admitting: Internal Medicine

## 2022-09-08 ENCOUNTER — Encounter: Payer: Self-pay | Admitting: Internal Medicine

## 2022-09-08 VITALS — BP 89/73 | HR 96 | Resp 18 | Ht 68.0 in | Wt 166.6 lb

## 2022-09-08 DIAGNOSIS — I5033 Acute on chronic diastolic (congestive) heart failure: Secondary | ICD-10-CM

## 2022-09-08 DIAGNOSIS — R0602 Shortness of breath: Secondary | ICD-10-CM

## 2022-09-08 DIAGNOSIS — I429 Cardiomyopathy, unspecified: Secondary | ICD-10-CM | POA: Diagnosis not present

## 2022-09-08 MED ORDER — EMPAGLIFLOZIN 25 MG PO TABS: 25.0000 mg | ORAL_TABLET | Freq: Every day | ORAL | 11 refills | Status: DC

## 2022-09-08 MED ORDER — POTASSIUM CHLORIDE ER 10 MEQ PO TBCR: 10.0000 meq | EXTENDED_RELEASE_TABLET | Freq: Every day | ORAL | 3 refills | Status: DC | PRN

## 2022-09-08 MED ORDER — FUROSEMIDE 20 MG PO TABS: 20.0000 mg | ORAL_TABLET | Freq: Every day | ORAL | 0 refills | Status: DC | PRN

## 2022-09-17 ENCOUNTER — Telehealth: Payer: Self-pay

## 2022-09-17 DIAGNOSIS — E1165 Type 2 diabetes mellitus with hyperglycemia: Secondary | ICD-10-CM | POA: Diagnosis not present

## 2022-09-17 DIAGNOSIS — I5032 Chronic diastolic (congestive) heart failure: Secondary | ICD-10-CM | POA: Diagnosis not present

## 2022-09-17 DIAGNOSIS — N529 Male erectile dysfunction, unspecified: Secondary | ICD-10-CM | POA: Diagnosis not present

## 2022-09-17 NOTE — Telephone Encounter (Signed)
Patient PCP wants you to clear patient to go back. Patient PCP mention he had an office visit with her and he was a little dizzy. This is there Fax Number: [phone]

## 2022-09-22 DIAGNOSIS — E875 Hyperkalemia: Secondary | ICD-10-CM | POA: Diagnosis not present

## 2022-10-20 ENCOUNTER — Ambulatory Visit: Payer: BLUE CROSS/BLUE SHIELD | Admitting: Internal Medicine

## 2022-10-21 ENCOUNTER — Ambulatory Visit: Payer: Self-pay | Admitting: Internal Medicine

## 2022-10-22 NOTE — Progress Notes (Signed)
Reschedule office visit after cardiac MRI is obtained

## 2022-11-04 DIAGNOSIS — I5032 Chronic diastolic (congestive) heart failure: Secondary | ICD-10-CM | POA: Diagnosis not present

## 2022-11-04 DIAGNOSIS — Z79899 Other long term (current) drug therapy: Secondary | ICD-10-CM | POA: Diagnosis not present

## 2022-11-04 DIAGNOSIS — E1165 Type 2 diabetes mellitus with hyperglycemia: Secondary | ICD-10-CM | POA: Diagnosis not present

## 2022-11-04 DIAGNOSIS — E782 Mixed hyperlipidemia: Secondary | ICD-10-CM | POA: Diagnosis not present

## 2022-11-04 DIAGNOSIS — I10 Essential (primary) hypertension: Secondary | ICD-10-CM | POA: Diagnosis not present

## 2022-12-03 DIAGNOSIS — I10 Essential (primary) hypertension: Secondary | ICD-10-CM | POA: Diagnosis not present

## 2022-12-25 DIAGNOSIS — N529 Male erectile dysfunction, unspecified: Secondary | ICD-10-CM | POA: Diagnosis not present

## 2022-12-25 DIAGNOSIS — R6882 Decreased libido: Secondary | ICD-10-CM | POA: Diagnosis not present

## 2023-01-12 DIAGNOSIS — E291 Testicular hypofunction: Secondary | ICD-10-CM | POA: Diagnosis not present

## 2023-01-19 ENCOUNTER — Encounter: Payer: Self-pay | Admitting: Cardiology

## 2023-01-19 ENCOUNTER — Ambulatory Visit: Payer: BLUE CROSS/BLUE SHIELD | Admitting: Cardiology

## 2023-01-19 VITALS — BP 139/93 | HR 97 | Resp 16 | Ht 68.0 in | Wt 178.0 lb

## 2023-01-19 DIAGNOSIS — R9431 Abnormal electrocardiogram [ECG] [EKG]: Secondary | ICD-10-CM | POA: Diagnosis not present

## 2023-01-19 DIAGNOSIS — R0689 Other abnormalities of breathing: Secondary | ICD-10-CM | POA: Diagnosis not present

## 2023-01-19 DIAGNOSIS — R0602 Shortness of breath: Secondary | ICD-10-CM

## 2023-01-19 DIAGNOSIS — I5032 Chronic diastolic (congestive) heart failure: Secondary | ICD-10-CM

## 2023-01-19 DIAGNOSIS — E291 Testicular hypofunction: Secondary | ICD-10-CM | POA: Diagnosis not present

## 2023-01-19 DIAGNOSIS — E1165 Type 2 diabetes mellitus with hyperglycemia: Secondary | ICD-10-CM | POA: Diagnosis not present

## 2023-01-19 DIAGNOSIS — Z125 Encounter for screening for malignant neoplasm of prostate: Secondary | ICD-10-CM | POA: Diagnosis not present

## 2023-01-19 NOTE — Progress Notes (Signed)
ID:  Michael Shannon, DOB 05/22/1976, MRN [id number]  PCP:  Aliene Beams, MD  Cardiologist:  Tessa Lerner, DO, [facility] (established care 01/19/23) Former Cardiology Providers: Dr. Clotilde Dieter  Date: 01/19/23 Last Office Visit: September 08, 2022  Chief Complaint  Patient presents with   Shortness of Breath   Abnormal ECG   New Patient (Initial Visit)    HPI  Michael Shannon is a 47 y.o. African-American male whose past medical history and cardiovascular risk factors include: Hypertension, diabetes, Covid infection Feb 2024, HFpEF (dx in Feb 2024 due to untreated HTN),   Patient was formally seen by my partner Dr. Clotilde Dieter in March 2024.  She was concerned for possible cardiac amyloidosis so she requested cardiac MRI & gene testing.  He presents today for follow-up after seeing his primary care provider.  He denies anginal chest pain.  He states that he is having difficulty taking a full breath and it started this past Sunday, January 17, 2023.  He took Lasix and it did help with symptoms.  He denies orthopnea, PND, lower extremity swelling.  Noncardiac wise has been having some sinus issues with blocked nostrils and headaches usually in the morning.  Home blood pressures are 149/91.  I do not have a blood pressure log to review.  Cardiac MRI is scheduled for this Friday, 01/22/2023.  He has been diagnosed with HFpEF prior to establishing care with myself.  His medications reconciled.  There is room to uptitrate GDMT but appears that he has had episodes of hypotension in the past.  He denies orthopnea, PND, or lower extremity swelling.  He uses Lasix on as needed basis.  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  Echocardiogram: February 2024:  1. Left ventricular ejection fraction, by estimation, is 55 to 60%. The  left ventricle has normal function. The left  ventricle has no regional  wall motion abnormalities. There is moderate concentric left ventricular  hypertrophy. Indeterminate diastolic  filling due to E-A fusion.   2. Right ventricular systolic function is normal. The right ventricular  size is normal.   3. Left atrial size was mildly dilated.   4. Moderate pleural effusion in the left lateral region.   5. The mitral valve is normal in structure. No evidence of mitral valve  regurgitation.   6. The aortic valve is tricuspid. Aortic valve regurgitation is not  visualized. No aortic stenosis is present.   7. The inferior vena cava is normal in size with greater than 50%  respiratory variability, suggesting right atrial pressure of 3 mmHg.     ALLERGIES: Allergies  Allergen Reactions   Penicillins Other (See Comments)    Unsure of reaction.    MEDICATION LIST PRIOR TO VISIT: Current Meds  Medication Sig   empagliflozin (JARDIANCE) 25 MG TABS tablet Take 1 tablet (25 mg total) by mouth daily before breakfast.   furosemide (LASIX) 20 MG tablet Take 1 tablet (20 mg total) by mouth daily as needed for edema or fluid (take in case of weigh gain 2 to 3 lbs in 24 hrs or 5 lbs in 7 days,).   glipiZIDE (GLUCOTROL XL) 5 MG 24 hr tablet Take 5 mg by mouth daily.   losartan (COZAAR) 25 MG tablet Take 12.5 mg by mouth daily.   metFORMIN (GLUCOPHAGE) 500 MG tablet Take 1 tablet (500 mg total) by mouth daily with breakfast. (Patient taking differently:  Take 1,000 mg by mouth 2 (two) times daily with a meal.)   rosuvastatin (CRESTOR) 5 MG tablet Take 5 mg by mouth daily.   tadalafil (CIALIS) 5 MG tablet Take 5 mg by mouth daily.     PAST MEDICAL HISTORY: Past Medical History:  Diagnosis Date   Diabetes mellitus without complication (HCC)    Hypertension    Pain     PAST SURGICAL HISTORY: Past Surgical History:  Procedure Laterality Date   CHEEK AUGMENTATION     digestive surgery  1978   FINGER SURGERY Left 2007   middle finger     FAMILY HISTORY: The patient family history includes Diabetes in his maternal grandfather and mother; Hypertension in his maternal grandfather and mother; Other in his father.  SOCIAL HISTORY:  The patient  reports that he has never smoked. He has never used smokeless tobacco. He reports that he does not currently use alcohol. He reports that he does not use drugs.  REVIEW OF SYSTEMS: Review of Systems  HENT:         Blocked nostrils, sinuses, headache  Cardiovascular:  Negative for chest pain, claudication, dyspnea on exertion, irregular heartbeat, leg swelling, near-syncope, orthopnea, palpitations, paroxysmal nocturnal dyspnea and syncope.  Respiratory:  Positive for shortness of breath (Describes as difficulty taking a full breath  in).   Hematologic/Lymphatic: Negative for bleeding problem.  Musculoskeletal:  Negative for muscle cramps and myalgias.  Neurological:  Negative for dizziness and light-headedness.    PHYSICAL EXAM:    01/19/2023   10:19 AM 09/08/2022    8:36 AM 08/16/2022   11:49 AM  Vitals with BMI  Height 5\' 8"  5\' 8"    Weight 178 lbs 166 lbs 10 oz   BMI 27.07 25.34   Systolic 139 89 137  Diastolic 93 73 97  Pulse 97 96 100    Physical Exam  Constitutional: No distress.  Age appropriate, hemodynamically stable.   Neck: No JVD present.  Cardiovascular: Normal rate, regular rhythm, S1 normal, S2 normal, intact distal pulses and normal pulses. Exam reveals no gallop, no S3 and no S4.  No murmur heard. Pulmonary/Chest: Effort normal and breath sounds normal. No stridor. He has no wheezes. He has no rales.  Abdominal: Soft. Bowel sounds are normal. He exhibits no distension. There is no abdominal tenderness.  Musculoskeletal:        General: No edema.     Cervical back: Neck supple.  Neurological: He is alert and oriented to person, place, and time. He has intact cranial nerves (2-12).  Skin: Skin is warm and moist.    LABORATORY DATA:    Latest Ref Rng  & Units 08/14/2022   10:25 AM 08/02/2018    9:37 AM  CBC  WBC 4.0 - 10.5 K/uL 9.6  9.8   Hemoglobin 13.0 - 17.0 g/dL 14.7  82.9   Hematocrit 39.0 - 52.0 % 45.6  40.4   Platelets 150 - 400 K/uL 200         Latest Ref Rng & Units 08/16/2022   12:17 AM 08/15/2022   12:31 AM 08/14/2022   10:25 AM  CMP  Glucose 70 - 99 mg/dL 562  130  865   BUN 6 - 20 mg/dL 13  14  13    Creatinine 0.61 - 1.24 mg/dL 7.84  6.96  2.95   Sodium 135 - 145 mmol/L 135  134  134   Potassium 3.5 - 5.1 mmol/L 4.0  3.6  4.4   Chloride 98 -  111 mmol/L 98  95  100   CO2 22 - 32 mmol/L 28  29  23    Calcium 8.9 - 10.3 mg/dL 8.6  8.8  8.9   Total Protein 6.5 - 8.1 g/dL  6.4  6.3   Total Bilirubin 0.3 - 1.2 mg/dL  0.7  1.4   Alkaline Phos 38 - 126 U/L  74  74   AST 15 - 41 U/L  38  57   ALT 0 - 44 U/L  76  89     Lab Results  Component Value Date   CHOL 111 08/16/2022   HDL 48 08/16/2022   LDLCALC 52 08/16/2022   TRIG 55 08/16/2022   CHOLHDL 2.3 08/16/2022   No components found for: "NTPROBNP" No results for input(s): "PROBNP" in the last 8760 hours. No results for input(s): "TSH" in the last 8760 hours.  BMP Recent Labs    08/14/22 1025 08/15/22 0031 08/16/22 0017  NA 134* 134* 135  K 4.4 3.6 4.0  CL 100 95* 98  CO2 23 29 28   GLUCOSE 278* 300* 199*  BUN 13 14 13   CREATININE 0.90 1.13 1.18  CALCIUM 8.9 8.8* 8.6*  GFRNONAA >60 >60 >60    HEMOGLOBIN A1C Lab Results  Component Value Date   HGBA1C 11.2 (H) 08/15/2022   MPG 274.74 08/15/2022    IMPRESSION:    ICD-10-CM   1. Shortness of breath  R06.02 EKG 12-Lead    PCV MYOCARDIAL PERFUSION WO LEXISCAN    2. Abnormal EKG  R94.31 PCV MYOCARDIAL PERFUSION WO LEXISCAN    3. Chronic heart failure with preserved ejection fraction (HFpEF) (HCC)  I50.32 PCV MYOCARDIAL PERFUSION WO LEXISCAN    4. Abnormal electrocardiogram  R94.31 PCV MYOCARDIAL PERFUSION WO LEXISCAN       RECOMMENDATIONS: Michael Shannon is a 47 y.o. African-American male  whose past medical history and cardiac risk factors include: Hypertension, diabetes, Covid infection Feb 2024, HFpEF (dx in Feb 2024 due to untreated HTN),   Patient presents today for follow-up as he is having difficulty taking a full breath in.  However clinically he is euvolemic and not in congestive heart failure.  But prior to establishing care with myself he was diagnosed with HFpEF in February 2024 due to uncontrolled hypertension.  Medications reconciled he definitely has room to uptitrate GDMT but has had episodes of hypotension.  He uses Lasix on as needed basis.  For colic was concerned that he may have cardiac amyloidosis and that is why cardiac MRI was ordered as well as gene testing.  Cardiac MRI is scheduled for later this week on 01/22/2023 and gene testing results are currently not available for my review.  Further recommendations to follow  EKG shows sinus rhythm with concerns for possible old inferior and anterolateral infarct.  The cardiac MRI will shed some light with regards to this; however, the shared decision was also to proceed with exercise nuclear stress test to evaluate for reversible ischemia and prior infarct.  Of note he is not having any active anginal chest pain.  Further recommendations to follow.  FINAL MEDICATION LIST END OF ENCOUNTER: No orders of the defined types were placed in this encounter.   Medications Discontinued During This Encounter  Medication Reason   potassium chloride (KLOR-CON) 10 MEQ tablet Patient Preference     Current Outpatient Medications:    empagliflozin (JARDIANCE) 25 MG TABS tablet, Take 1 tablet (25 mg total) by mouth daily before breakfast., Disp: 30 tablet, Rfl:  11   furosemide (LASIX) 20 MG tablet, Take 1 tablet (20 mg total) by mouth daily as needed for edema or fluid (take in case of weigh gain 2 to 3 lbs in 24 hrs or 5 lbs in 7 days,)., Disp: 30 tablet, Rfl: 0   glipiZIDE (GLUCOTROL XL) 5 MG 24 hr tablet, Take 5 mg by mouth  daily., Disp: , Rfl:    losartan (COZAAR) 25 MG tablet, Take 12.5 mg by mouth daily., Disp: , Rfl:    metFORMIN (GLUCOPHAGE) 500 MG tablet, Take 1 tablet (500 mg total) by mouth daily with breakfast. (Patient taking differently: Take 1,000 mg by mouth 2 (two) times daily with a meal.), Disp: 30 tablet, Rfl: 0   rosuvastatin (CRESTOR) 5 MG tablet, Take 5 mg by mouth daily., Disp: , Rfl:    tadalafil (CIALIS) 5 MG tablet, Take 5 mg by mouth daily., Disp: , Rfl:   Orders Placed This Encounter  Procedures   PCV MYOCARDIAL PERFUSION WO LEXISCAN   EKG 12-Lead    There are no Patient Instructions on file for this visit.   --Continue cardiac medications as reconciled in final medication list. --Return in about 4 weeks (around 02/16/2023) for Follow up, Review test results. or sooner if needed. --Continue follow-up with your primary care physician regarding the management of your other chronic comorbid conditions.  Patient's questions and concerns were addressed to his satisfaction. He voices understanding of the instructions provided during this encounter.   This note was created using a voice recognition software as a result there may be grammatical errors inadvertently enclosed that do not reflect the nature of this encounter. Every attempt is made to correct such errors.  Tessa Lerner, [place], [facility]  Pager:  [phone] Office: [phone]

## 2023-01-21 ENCOUNTER — Telehealth (HOSPITAL_COMMUNITY): Payer: Self-pay | Admitting: *Deleted

## 2023-01-21 NOTE — Telephone Encounter (Signed)
Attempted to call patient regarding upcoming cardiac MRI appointment. Left message on voicemail with name and callback number  Merle Prescott RN Navigator Cardiac Imaging [facility] [phone] Office [phone] Cell  

## 2023-01-22 ENCOUNTER — Other Ambulatory Visit: Payer: Self-pay | Admitting: Internal Medicine

## 2023-01-22 ENCOUNTER — Ambulatory Visit (HOSPITAL_COMMUNITY)
Admission: RE | Admit: 2023-01-22 | Discharge: 2023-01-22 | Disposition: A | Discharge status: Home / Self Care | Payer: BC Managed Care – PPO | Source: Ambulatory Visit | Attending: Internal Medicine | Admitting: Internal Medicine

## 2023-01-22 DIAGNOSIS — I5033 Acute on chronic diastolic (congestive) heart failure: Secondary | ICD-10-CM

## 2023-01-22 DIAGNOSIS — I429 Cardiomyopathy, unspecified: Secondary | ICD-10-CM

## 2023-01-22 MED ORDER — GADOBUTROL 1 MMOL/ML IV SOLN
8.0000 mL | Freq: Once | INTRAVENOUS | Status: AC | PRN
  Administered 2023-01-22: 8 mL via INTRAVENOUS

## 2023-01-28 ENCOUNTER — Other Ambulatory Visit: Payer: BC Managed Care – PPO

## 2023-02-04 ENCOUNTER — Ambulatory Visit: Payer: BC Managed Care – PPO

## 2023-02-04 ENCOUNTER — Other Ambulatory Visit: Payer: BC Managed Care – PPO

## 2023-02-04 DIAGNOSIS — I5032 Chronic diastolic (congestive) heart failure: Secondary | ICD-10-CM | POA: Diagnosis not present

## 2023-02-04 DIAGNOSIS — R0602 Shortness of breath: Secondary | ICD-10-CM | POA: Diagnosis not present

## 2023-02-04 DIAGNOSIS — R9431 Abnormal electrocardiogram [ECG] [EKG]: Secondary | ICD-10-CM | POA: Diagnosis not present

## 2023-02-04 DIAGNOSIS — N529 Male erectile dysfunction, unspecified: Secondary | ICD-10-CM | POA: Diagnosis not present

## 2023-02-04 DIAGNOSIS — E291 Testicular hypofunction: Secondary | ICD-10-CM | POA: Diagnosis not present

## 2023-02-19 ENCOUNTER — Ambulatory Visit: Payer: BC Managed Care – PPO | Admitting: Cardiology

## 2023-02-19 ENCOUNTER — Encounter: Payer: Self-pay | Admitting: Cardiology

## 2023-02-19 VITALS — BP 137/89 | HR 89 | Resp 16 | Ht 68.0 in | Wt 179.0 lb

## 2023-02-19 DIAGNOSIS — I10 Essential (primary) hypertension: Secondary | ICD-10-CM

## 2023-02-19 DIAGNOSIS — I5032 Chronic diastolic (congestive) heart failure: Secondary | ICD-10-CM

## 2023-02-19 DIAGNOSIS — E119 Type 2 diabetes mellitus without complications: Secondary | ICD-10-CM

## 2023-02-19 NOTE — Progress Notes (Signed)
ID:  Michael Shannon, DOB Dec 10, 1975, MRN [id number]  PCP:  Aliene Beams, MD  Cardiologist:  Tessa Lerner, DO, [facility] (established care 01/19/23) Former Cardiology Providers: Dr. Clotilde Dieter  Date: 02/19/23 Last Office Visit: 01/19/2023  Chief Complaint  Patient presents with   Chronic heart failure with preserved ejection fraction (HFp    HPI  Michael Shannon is a 47 y.o. African-American male whose past medical history and cardiovascular risk factors include: Hypertension, diabetes, Covid infection Feb 2024, HFpEF (dx in Feb 2024 due to untreated HTN),   Patient was originally under the care of my former partner Dr. Clotilde Dieter and started a workup for possible cardiac amyloidosis.  Since last office visit patient did undergo cardiac MRI which was suggestive of cardiac amyloidosis.  However, he has not undergone a monoclonal protein screen in the past.  Since last office visit he also underwent stress test which reported fixed perfusion defect without obvious reversibility.  Stress test was reported to be high risk secondary to reduced LVEF per SPECT imaging.  Clinically he denies anginal chest pain or heart failure symptoms.  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  Echocardiogram: February 2024:  1. Left ventricular ejection fraction, by estimation, is 55 to 60%. The  left ventricle has normal function. The left ventricle has no regional  wall motion abnormalities. There is moderate concentric left ventricular  hypertrophy. Indeterminate diastolic  filling due to E-A fusion.   2. Right ventricular systolic function is normal. The right ventricular  size is normal.   3. Left atrial size was mildly dilated.   4. Moderate pleural effusion in the left lateral region.   5. The mitral valve is normal in structure. No evidence of mitral valve  regurgitation.    6. The aortic valve is tricuspid. Aortic valve regurgitation is not  visualized. No aortic stenosis is present.   7. The inferior vena cava is normal in size with greater than 50%  respiratory variability, suggesting right atrial pressure of 3 mmHg.   Exercise nuclear stress test 02/04/2023: Myocardial perfusion is abnormal. There is a fixed mild defect in the inferior and apical regions possible suggesting diaphragmatic attenuation and apical thinning. Minimal ischemia/scar in this region cannot be excluded.  LV is mildly dilated in both rest and stress images with normal TID ratio. Overall LV systolic function is abnormal with global hypokinesis. Stress LV EF: 32%.  The patient exercised for 7 minutes on Bruce protocol, achieving approximately 8.58 METs & 91% MPHR.  Resting EKG demonstrated possibly ectopic atrial rhythm, inferior infarct and anterolateral infarct old. Stress EKG reveals possible atrial tachycardia with 2: 1 conduction, associated nonspecific ST-T abnormality. There is mild QRS widening. Stress EKG without specific ST-T wave changes for ischemia. Normal BP response. Accelerated Heart rate response. No chest pain, test terminated due to THR achieved.  No prior studies for comparison, high risk study due to abnormal LVEF.   Cardiac MRI  01/2023 1. Findings most consistent with cardiac amyloidosis. ECV elevated 46%. Diffuse delayed myocardial enhancement throughout the left ventricular myocardium, involving the right ventricle as well. Severely increased LV wall thickness in the mid ventricular septum.    2. Biventricular function appears grossly preserved with normal biventricular chamber size. Volumes could not be accurately calculated.    ALLERGIES: Allergies  Allergen Reactions   Penicillins Other (See Comments)    Unsure of reaction.    MEDICATION  LIST PRIOR TO VISIT: Current Meds  Medication Sig   empagliflozin (JARDIANCE) 25 MG TABS tablet Take 1 tablet (25  mg total) by mouth daily before breakfast.   furosemide (LASIX) 20 MG tablet Take 1 tablet (20 mg total) by mouth daily as needed for edema or fluid (take in case of weigh gain 2 to 3 lbs in 24 hrs or 5 lbs in 7 days,).   glipiZIDE (GLUCOTROL XL) 5 MG 24 hr tablet Take 5 mg by mouth daily.   losartan (COZAAR) 25 MG tablet Take 12.5 mg by mouth daily.   metFORMIN (GLUCOPHAGE) 500 MG tablet Take 1 tablet (500 mg total) by mouth daily with breakfast. (Patient taking differently: Take 1,000 mg by mouth 2 (two) times daily with a meal.)   rosuvastatin (CRESTOR) 5 MG tablet Take 5 mg by mouth daily.   tadalafil (CIALIS) 5 MG tablet Take 5 mg by mouth daily.     PAST MEDICAL HISTORY: Past Medical History:  Diagnosis Date   Diabetes mellitus without complication (HCC)    Hypertension    Pain     PAST SURGICAL HISTORY: Past Surgical History:  Procedure Laterality Date   CHEEK AUGMENTATION     digestive surgery  1978   FINGER SURGERY Left 2007   middle finger    FAMILY HISTORY: The patient family history includes Diabetes in his maternal grandfather and mother; Hypertension in his maternal grandfather and mother; Other in his father.  SOCIAL HISTORY:  The patient  reports that he has never smoked. He has never used smokeless tobacco. He reports that he does not currently use alcohol. He reports that he does not use drugs.  REVIEW OF SYSTEMS: Review of Systems  Cardiovascular:  Negative for chest pain, claudication, dyspnea on exertion, irregular heartbeat, leg swelling, near-syncope, orthopnea, palpitations, paroxysmal nocturnal dyspnea and syncope.  Respiratory:  Negative for shortness of breath.   Hematologic/Lymphatic: Negative for bleeding problem.  Musculoskeletal:  Negative for muscle cramps and myalgias.  Neurological:  Negative for dizziness and light-headedness.    PHYSICAL EXAM:    02/19/2023    2:31 PM 01/19/2023   10:19 AM 09/08/2022    8:36 AM  Vitals with BMI  Height  5\' 8"  5\' 8"  5\' 8"   Weight 179 lbs 178 lbs 166 lbs 10 oz  BMI 27.22 27.07 25.34  Systolic 137 139 89  Diastolic 89 93 73  Pulse 89 97 96    Physical Exam  Constitutional: No distress.  Age appropriate, hemodynamically stable.   Neck: No JVD present.  Cardiovascular: Normal rate, regular rhythm, S1 normal, S2 normal, intact distal pulses and normal pulses. Exam reveals no gallop, no S3 and no S4.  No murmur heard. Pulmonary/Chest: Effort normal and breath sounds normal. No stridor. He has no wheezes. He has no rales.  Abdominal: Soft. Bowel sounds are normal. He exhibits no distension. There is no abdominal tenderness.  Musculoskeletal:        General: No edema.     Cervical back: Neck supple.  Neurological: He is alert and oriented to person, place, and time. He has intact cranial nerves (2-12).  Skin: Skin is warm and moist.    LABORATORY DATA:    Latest Ref Rng & Units 08/14/2022   10:25 AM 08/02/2018    9:37 AM  CBC  WBC 4.0 - 10.5 K/uL 9.6  9.8   Hemoglobin 13.0 - 17.0 g/dL 56.3  87.5   Hematocrit 39.0 - 52.0 % 45.6  40.4  Platelets 150 - 400 K/uL 200         Latest Ref Rng & Units 08/16/2022   12:17 AM 08/15/2022   12:31 AM 08/14/2022   10:25 AM  CMP  Glucose 70 - 99 mg/dL 283  151  761   BUN 6 - 20 mg/dL 13  14  13    Creatinine 0.61 - 1.24 mg/dL 6.07  3.71  0.62   Sodium 135 - 145 mmol/L 135  134  134   Potassium 3.5 - 5.1 mmol/L 4.0  3.6  4.4   Chloride 98 - 111 mmol/L 98  95  100   CO2 22 - 32 mmol/L 28  29  23    Calcium 8.9 - 10.3 mg/dL 8.6  8.8  8.9   Total Protein 6.5 - 8.1 g/dL  6.4  6.3   Total Bilirubin 0.3 - 1.2 mg/dL  0.7  1.4   Alkaline Phos 38 - 126 U/L  74  74   AST 15 - 41 U/L  38  57   ALT 0 - 44 U/L  76  89     Lab Results  Component Value Date   CHOL 111 08/16/2022   HDL 48 08/16/2022   LDLCALC 52 08/16/2022   TRIG 55 08/16/2022   CHOLHDL 2.3 08/16/2022   No components found for: "NTPROBNP" No results for input(s): "PROBNP" in the  last 8760 hours. No results for input(s): "TSH" in the last 8760 hours.  BMP Recent Labs    08/14/22 1025 08/15/22 0031 08/16/22 0017  NA 134* 134* 135  K 4.4 3.6 4.0  CL 100 95* 98  CO2 23 29 28   GLUCOSE 278* 300* 199*  BUN 13 14 13   CREATININE 0.90 1.13 1.18  CALCIUM 8.9 8.8* 8.6*  GFRNONAA >60 >60 >60    HEMOGLOBIN A1C Lab Results  Component Value Date   HGBA1C 11.2 (H) 08/15/2022   MPG 274.74 08/15/2022    IMPRESSION:    ICD-10-CM   1. Chronic heart failure with preserved ejection fraction (HFpEF) (HCC)  I50.32 Multiple Myeloma Panel (SPEP&IFE w/QIG)    UPEP/UIFE/Light Chains/TP, 24-Hr Ur    NM CARDIAC AMYLOID TUMOR LOC INFLAM SPECT 1 DAY    Ambulatory referral to Cardiology    2. Benign hypertension  I10     3. Non-insulin dependent type 2 diabetes mellitus (HCC)  E11.9         RECOMMENDATIONS: Erlon Gasbarro is a 47 y.o. African-American male whose past medical history and cardiac risk factors include: Hypertension, diabetes, Covid infection Feb 2024, HFpEF (dx in Feb 2024 due to untreated HTN),   Chronic heart failure with preserved ejection fraction (HFpEF) (HCC) Clinically euvolemic. Diagnosed with HFpEF in February 2024 prior to establishing care with myself likely secondary to uncontrolled hypertension.   We discussed uptitration of GDMT-however we will hold off given his history of hypotension. Medications reconciled. Cardiac MRI concerning for possible cardiac amyloidosis. Will check myeloma panel, UPEP, urine IFE and PYP study.   Will refer him to Dr. Gala Romney for further evaluation and management  Benign hypertension Office blood pressures are acceptable. Medications reconciled. Monitor for now  Non-insulin dependent type 2 diabetes mellitus (HCC) Most recent hemoglobin A1c 11.2 at the February 2024. Reemphasized importance of glycemic control and to follow-up with his provider for further medication titration Currently on Jardiance, ARB,  statin therapy.  FINAL MEDICATION LIST END OF ENCOUNTER: No orders of the defined types were placed in this encounter.   There are  no discontinued medications.    Current Outpatient Medications:    empagliflozin (JARDIANCE) 25 MG TABS tablet, Take 1 tablet (25 mg total) by mouth daily before breakfast., Disp: 30 tablet, Rfl: 11   furosemide (LASIX) 20 MG tablet, Take 1 tablet (20 mg total) by mouth daily as needed for edema or fluid (take in case of weigh gain 2 to 3 lbs in 24 hrs or 5 lbs in 7 days,)., Disp: 30 tablet, Rfl: 0   glipiZIDE (GLUCOTROL XL) 5 MG 24 hr tablet, Take 5 mg by mouth daily., Disp: , Rfl:    losartan (COZAAR) 25 MG tablet, Take 12.5 mg by mouth daily., Disp: , Rfl:    metFORMIN (GLUCOPHAGE) 500 MG tablet, Take 1 tablet (500 mg total) by mouth daily with breakfast. (Patient taking differently: Take 1,000 mg by mouth 2 (two) times daily with a meal.), Disp: 30 tablet, Rfl: 0   rosuvastatin (CRESTOR) 5 MG tablet, Take 5 mg by mouth daily., Disp: , Rfl:    tadalafil (CIALIS) 5 MG tablet, Take 5 mg by mouth daily., Disp: , Rfl:   Orders Placed This Encounter  Procedures   NM CARDIAC AMYLOID TUMOR LOC INFLAM SPECT 1 DAY   Multiple Myeloma Panel (SPEP&IFE w/QIG)   UPEP/UIFE/Light Chains/TP, 24-Hr Ur   Ambulatory referral to Cardiology    There are no Patient Instructions on file for this visit.   --Continue cardiac medications as reconciled in final medication list. --Return in about 3 months (around 05/22/2023) for Follow up HFpEF. or sooner if needed. --Continue follow-up with your primary care physician regarding the management of your other chronic comorbid conditions.  Patient's questions and concerns were addressed to his satisfaction. He voices understanding of the instructions provided during this encounter.   This note was created using a voice recognition software as a result there may be grammatical errors inadvertently enclosed that do not reflect the  nature of this encounter. Every attempt is made to correct such errors.  Tessa Lerner, [place], [facility]  Pager:  [phone] Office: [phone]

## 2023-02-23 DIAGNOSIS — I10 Essential (primary) hypertension: Secondary | ICD-10-CM | POA: Diagnosis not present

## 2023-02-23 DIAGNOSIS — E782 Mixed hyperlipidemia: Secondary | ICD-10-CM | POA: Diagnosis not present

## 2023-02-23 DIAGNOSIS — E1165 Type 2 diabetes mellitus with hyperglycemia: Secondary | ICD-10-CM | POA: Diagnosis not present

## 2023-02-23 DIAGNOSIS — R6889 Other general symptoms and signs: Secondary | ICD-10-CM | POA: Diagnosis not present

## 2023-05-19 DIAGNOSIS — E291 Testicular hypofunction: Secondary | ICD-10-CM | POA: Diagnosis not present

## 2023-05-19 DIAGNOSIS — Z125 Encounter for screening for malignant neoplasm of prostate: Secondary | ICD-10-CM | POA: Diagnosis not present

## 2023-05-25 ENCOUNTER — Ambulatory Visit: Payer: Self-pay | Admitting: Cardiology

## 2023-05-27 DIAGNOSIS — E291 Testicular hypofunction: Secondary | ICD-10-CM | POA: Diagnosis not present

## 2023-05-27 DIAGNOSIS — N529 Male erectile dysfunction, unspecified: Secondary | ICD-10-CM | POA: Diagnosis not present

## 2023-05-27 DIAGNOSIS — R6882 Decreased libido: Secondary | ICD-10-CM | POA: Diagnosis not present

## 2023-06-02 DIAGNOSIS — E1165 Type 2 diabetes mellitus with hyperglycemia: Secondary | ICD-10-CM | POA: Diagnosis not present

## 2023-06-02 DIAGNOSIS — E782 Mixed hyperlipidemia: Secondary | ICD-10-CM | POA: Diagnosis not present

## 2023-06-02 DIAGNOSIS — I10 Essential (primary) hypertension: Secondary | ICD-10-CM | POA: Diagnosis not present

## 2023-06-02 DIAGNOSIS — R600 Localized edema: Secondary | ICD-10-CM | POA: Diagnosis not present

## 2023-07-02 ENCOUNTER — Other Ambulatory Visit (HOSPITAL_COMMUNITY): Payer: Self-pay

## 2023-07-02 ENCOUNTER — Encounter (HOSPITAL_COMMUNITY): Payer: Self-pay | Admitting: Internal Medicine

## 2023-07-02 ENCOUNTER — Ambulatory Visit (HOSPITAL_COMMUNITY)
Admission: RE | Admit: 2023-07-02 | Discharge: 2023-07-02 | Disposition: A | Discharge status: Home / Self Care | Payer: BC Managed Care – PPO | Source: Ambulatory Visit | Attending: Internal Medicine | Admitting: Internal Medicine

## 2023-07-02 VITALS — BP 124/80 | HR 88 | Wt 189.6 lb

## 2023-07-02 DIAGNOSIS — R9431 Abnormal electrocardiogram [ECG] [EKG]: Secondary | ICD-10-CM | POA: Insufficient documentation

## 2023-07-02 DIAGNOSIS — I110 Hypertensive heart disease with heart failure: Secondary | ICD-10-CM | POA: Diagnosis not present

## 2023-07-02 DIAGNOSIS — I5032 Chronic diastolic (congestive) heart failure: Secondary | ICD-10-CM | POA: Insufficient documentation

## 2023-07-02 DIAGNOSIS — I10 Essential (primary) hypertension: Secondary | ICD-10-CM

## 2023-07-02 DIAGNOSIS — I428 Other cardiomyopathies: Secondary | ICD-10-CM | POA: Diagnosis not present

## 2023-07-02 DIAGNOSIS — E119 Type 2 diabetes mellitus without complications: Secondary | ICD-10-CM | POA: Diagnosis not present

## 2023-07-02 DIAGNOSIS — R9439 Abnormal result of other cardiovascular function study: Secondary | ICD-10-CM | POA: Insufficient documentation

## 2023-07-02 MED ORDER — ENTRESTO 49-51 MG PO TABS: 1.0000 | ORAL_TABLET | Freq: Two times a day (BID) | ORAL | 3 refills | Status: DC

## 2023-07-02 NOTE — Progress Notes (Signed)
 ADVANCED HF CLINIC CONSULT NOTE  Referring Physician: Rolinda Millman, MD Primary Care: Rolinda Millman, MD Primary Cardiologist: Madonna Large, DO  Chief Complaint: Cardiac amyloidosis  HPI:  Michael Shannon is a 48 y.o. male with severe HTN, DM2 who is referred by Dr. 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   Dx'd with acute diastolic HF in 2/24 in setting of severe HTN. Work-up as below.   - Echo 2/24 EF 55-60% moderate LVH - cMRI 8/24 LVEF 50% Severe LVH ECV 46% Findings most consistent with cardiac amyloidosis. ECV elevated 46%. Diffuse delayed myocardial enhancement throughout the left ventricular myocardium, involving the right ventricle as well. - Exercise stress 8/24 There is a fixed mild defect in the inferior and apical regions possible suggesting diaphragmatic attenuation and apical thinning. Minimal ischemia/scar in this region cannot be excluded. EF 32%  Saw Dr. Large recently and plan for myeloma panel and PYP but not completed yet.   Continues to work. Denies signficant SOB or CP. Occasional edema. No weight loss, fevers, chills   FHx: Mother HTN P Aunt HTN M Uncle with HF PGF HTN       Past Medical History:  Diagnosis Date   Diabetes mellitus without complication (HCC)    Hypertension    Pain     Current Outpatient Medications  Medication Sig Dispense Refill   atorvastatin (LIPITOR) 20 MG tablet Take 20 mg by mouth daily.     empagliflozin  (JARDIANCE ) 25 MG TABS tablet Take 1 tablet (25 mg total) by mouth daily before breakfast. 30 tablet 11   furosemide  (LASIX ) 20 MG tablet Take 20 mg by mouth daily.     glipiZIDE (GLUCOTROL) 5 MG tablet Take 10 mg by mouth daily before breakfast.     lisinopril  (ZESTRIL ) 40 MG tablet Take 40 mg by mouth daily.     losartan  (COZAAR ) 25 MG tablet Take 12.5 mg by mouth daily.     metFORMIN  (GLUCOPHAGE ) 500 MG tablet Take 1,000 mg by mouth.     rosuvastatin (CRESTOR) 5 MG tablet Take 5 mg by mouth  daily.     tadalafil (CIALIS) 5 MG tablet Take 5 mg by mouth daily.     No current facility-administered medications for this encounter.    Allergies  Allergen Reactions   Penicillins Other (See Comments)    Unsure of reaction.      Social History   Socioeconomic History   Marital status: Married    Spouse name: Andree   Number of children: 3   Years of education: 12   Highest education level: High school graduate  Occupational History   Occupation: chartered certified accountant  Tobacco Use   Smoking status: Never   Smokeless tobacco: Never  Vaping Use   Vaping status: Never Used  Substance and Sexual Activity   Alcohol use: Not Currently    Comment: occasionally - 1-2 drinks per month   Drug use: Never   Sexual activity: Yes  Other Topics Concern   Not on file  Social History Narrative   Lives with his wife and children.   Right-handed.   2-3 caffeine drinks per week.   Social Drivers of Corporate Investment Banker Strain: Not on file  Food Insecurity: No Food Insecurity (08/14/2022)   Hunger Vital Sign    Worried About Running Out of Food in the Last Year: Never true    Ran Out of Food in the Last Year: Never true  Transportation Needs: No Transportation Needs (08/14/2022)  PRAPARE - Administrator, Civil Service (Medical): No    Lack of Transportation (Non-Medical): No  Physical Activity: Not on file  Stress: Not on file  Social Connections: Not on file  Intimate Partner Violence: Not At Risk (08/14/2022)   Humiliation, Afraid, Rape, and Kick questionnaire    Fear of Current or Ex-Partner: No    Emotionally Abused: No    Physically Abused: No    Sexually Abused: No      Family History  Problem Relation Age of Onset   Diabetes Mother    Hypertension Mother    Other Father        overdose   Diabetes Maternal Grandfather    Hypertension Maternal Grandfather     Vitals:   07/02/23 0850  BP: 124/80  Pulse: 88  SpO2: 100%  Weight: 86 kg (189 lb 9.6 oz)     PHYSICAL EXAM: General:  Well appearing. No resp difficulty HEENT: normal Neck: supple. no JVD. Carotids 2+ bilat; no bruits. No lymphadenopathy or thryomegaly appreciated. Cor: PMI nondisplaced. Regular rate & rhythm. No rubs, gallops or murmurs. Lungs: clear Abdomen: soft, nontender, nondistended. No hepatosplenomegaly. No bruits or masses. Good bowel sounds. Extremities: no cyanosis, clubbing, rash, edema Neuro: alert & orientedx3, cranial nerves grossly intact. moves all 4 extremities w/o difficulty. Affect pleasant   ECG: Sinus inferolateral Qs 1AVB Personally reviewed   ASSESSMENT & PLAN:  1. 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 ECV elevated 46%. Diffuse delayed myocardial enhancement throughout the left ventricular myocardium, involving the right ventricle as well. - Exercise stress 8/24 There is a fixed mild defect in the inferior and apical regions possible suggesting diaphragmatic attenuation and apical thinning. Minimal ischemia/scar in this region cannot be excluded. 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 Can hold off on PYP for now  - discussed possible treatment modalities depending on w/u  3. Benign hypertension - BP ok. - med changes as above - consider MRA   4. DM2 - continue Jardiance   5. Abnormal stress test/ECG - concerning for underlying ischemic heart disease but no s/s angina - suspect may be false positive due to infiltrative process - will need eventual cardiac CT  Toribio Fuel, MD  9:43 AM

## 2023-07-02 NOTE — Patient Instructions (Addendum)
 Great to see you today!!!  Medication Changes:  STOP Lisinopril   STOP Losartan   START Entresto  49/51 mg Twice daily   Lab Work:  Labs done today, your results will be available in MyChart, we will contact you for abnormal readings.  Genetic test has been done, this has to be sent to [place]  to be processed and can take 1-2 weeks to get results back.  We will let you know the results.  Testing/Procedures:  none  Referrals:  none  Special Instructions // Education:  Do the following things EVERYDAY: Weigh yourself in the morning before breakfast. Write it down and keep it in a log. Take your medicines as prescribed Eat low salt foods--Limit salt (sodium) to 2000 mg per day.  Stay as active as you can everyday Limit all fluids for the day to less than 2 liters   Follow-Up in: 3-4 weeks   At the [facility], you and your health needs are our priority. We have a designated team specialized in the treatment of Heart Failure. This Care Team includes your primary Heart Failure Specialized Cardiologist (physician), Advanced Practice Providers (APPs- Physician Assistants and Nurse Practitioners), and Pharmacist who all work together to provide you with the care you need, when you need it.   You may see any of the following providers on your designated Care Team at your next follow up:  Dr. Toribio Fuel Dr. Ezra Shuck Dr. Ria Commander Dr. Odis Brownie Greig Mosses, NP Caffie Shed, [place] [facility] [place], [place] Beckey Coe, NP Jordan Lee, NP Tinnie Redman, PharmD   Please be sure to bring in all your medications bottles to every appointment.   Need to Contact Us :  If you have any questions or concerns before your next appointment please send us  a message through [place] or call our office at [phone].    TO LEAVE A MESSAGE FOR THE NURSE SELECT OPTION 2, PLEASE LEAVE A MESSAGE INCLUDING: YOUR NAME DATE OF BIRTH CALL BACK  NUMBER REASON FOR CALL**this is important as we prioritize the call backs  YOU WILL RECEIVE A CALL BACK THE SAME DAY AS LONG AS YOU CALL BEFORE 4:00 PM

## 2023-07-05 LAB — KAPPA/LAMBDA LIGHT CHAINS
Kappa free light chain: 16.6 mg/L (ref 3.3–19.4)
Kappa, lambda light chain ratio: 2.13 — ABNORMAL HIGH (ref 0.26–1.65)
Lambda free light chains: 7.8 mg/L (ref 5.7–26.3)

## 2023-07-09 LAB — MULTIPLE MYELOMA PANEL, SERUM
Albumin SerPl Elph-Mcnc: 4.1 g/dL (ref 2.9–4.4)
Albumin/Glob SerPl: 1.6 (ref 0.7–1.7)
Alpha 1: 0.2 g/dL (ref 0.0–0.4)
Alpha2 Glob SerPl Elph-Mcnc: 0.5 g/dL (ref 0.4–1.0)
B-Globulin SerPl Elph-Mcnc: 1.2 g/dL (ref 0.7–1.3)
Gamma Glob SerPl Elph-Mcnc: 0.8 g/dL (ref 0.4–1.8)
Globulin, Total: 2.7 g/dL (ref 2.2–3.9)
IgA: 396 mg/dL — ABNORMAL HIGH (ref 90–386)
IgG (Immunoglobin G), Serum: 1054 mg/dL (ref 603–1613)
IgM (Immunoglobulin M), Srm: 60 mg/dL (ref 20–172)
Total Protein ELP: 6.8 g/dL (ref 6.0–8.5)

## 2023-07-12 ENCOUNTER — Encounter (HOSPITAL_COMMUNITY): Payer: Self-pay

## 2023-07-14 ENCOUNTER — Encounter (HOSPITAL_COMMUNITY): Payer: Self-pay

## 2023-07-16 NOTE — Telephone Encounter (Signed)
Pt returned call aware of results

## 2023-07-30 ENCOUNTER — Encounter (HOSPITAL_COMMUNITY): Payer: Self-pay | Admitting: Internal Medicine

## 2023-07-30 ENCOUNTER — Ambulatory Visit (HOSPITAL_COMMUNITY)
Admission: RE | Admit: 2023-07-30 | Discharge: 2023-07-30 | Disposition: A | Discharge status: Home / Self Care | Payer: BC Managed Care – PPO | Source: Ambulatory Visit | Attending: Internal Medicine | Admitting: Internal Medicine

## 2023-07-30 VITALS — BP 112/78 | HR 88 | Wt 188.0 lb

## 2023-07-30 DIAGNOSIS — I10 Essential (primary) hypertension: Secondary | ICD-10-CM | POA: Diagnosis not present

## 2023-07-30 DIAGNOSIS — R9439 Abnormal result of other cardiovascular function study: Secondary | ICD-10-CM | POA: Insufficient documentation

## 2023-07-30 DIAGNOSIS — Z79899 Other long term (current) drug therapy: Secondary | ICD-10-CM | POA: Insufficient documentation

## 2023-07-30 DIAGNOSIS — I110 Hypertensive heart disease with heart failure: Secondary | ICD-10-CM | POA: Insufficient documentation

## 2023-07-30 DIAGNOSIS — I428 Other cardiomyopathies: Secondary | ICD-10-CM

## 2023-07-30 DIAGNOSIS — I5032 Chronic diastolic (congestive) heart failure: Secondary | ICD-10-CM | POA: Insufficient documentation

## 2023-07-30 DIAGNOSIS — E119 Type 2 diabetes mellitus without complications: Secondary | ICD-10-CM | POA: Insufficient documentation

## 2023-07-30 DIAGNOSIS — Z7984 Long term (current) use of oral hypoglycemic drugs: Secondary | ICD-10-CM | POA: Insufficient documentation

## 2023-07-30 DIAGNOSIS — I5022 Chronic systolic (congestive) heart failure: Secondary | ICD-10-CM

## 2023-07-30 DIAGNOSIS — E854 Organ-limited amyloidosis: Secondary | ICD-10-CM | POA: Insufficient documentation

## 2023-07-30 DIAGNOSIS — I43 Cardiomyopathy in diseases classified elsewhere: Secondary | ICD-10-CM | POA: Insufficient documentation

## 2023-07-30 DIAGNOSIS — I5033 Acute on chronic diastolic (congestive) heart failure: Secondary | ICD-10-CM

## 2023-07-30 MED ORDER — EPLERENONE 25 MG PO TABS: 12.5000 mg | ORAL_TABLET | Freq: Every day | ORAL | 3 refills | Status: AC

## 2023-07-30 NOTE — Progress Notes (Addendum)
 ADVANCED HF CLINIC NOTE  Referring Physician: Rolinda Millman, MD Primary Care: Rolinda Millman, MD Primary Cardiologist: Madonna Large, DO  Chief Complaint: Cardiac amyloidosis  HPI:  Michael Shannon is a 48 y.o. male with severe HTN, DM2 who is referred by Dr. 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   Dx'd with acute diastolic HF in 2/24 in setting of severe HTN. Work-up as below.   - Echo 2/24 EF 55-60% moderate LVH - cMRI 8/24 LVEF 50% Severe LVH ECV 46% Findings most consistent with cardiac amyloidosis. ECV elevated 46%. Diffuse delayed myocardial enhancement throughout the left ventricular myocardium, involving the right ventricle as well. - Exercise stress 8/24 There is a fixed mild defect in the inferior and apical regions possible suggesting diaphragmatic attenuation and apical thinning. Minimal ischemia/scar in this region cannot be excluded. EF 32%  Had SPEP which was negative for M-spike. Genetic testing negative for TTR  Continues to work. No CP or SOB. No edema. Feels good. Compliant with meds. Denies neuropathy, spinal stenosis, orthostasis, achilles injury or carpal tunnel.   FHx: Mother HTN P Aunt HTN M Uncle with HF PGF HTN     Past Medical History:  Diagnosis Date   Diabetes mellitus without complication (HCC)    Hypertension    Pain     Current Outpatient Medications  Medication Sig Dispense Refill   atorvastatin (LIPITOR) 20 MG tablet Take 20 mg by mouth daily.     empagliflozin  (JARDIANCE ) 25 MG TABS tablet Take 1 tablet (25 mg total) by mouth daily before breakfast. 30 tablet 11   furosemide  (LASIX ) 20 MG tablet Take 20 mg by mouth daily.     glipiZIDE (GLUCOTROL XL) 10 MG 24 hr tablet Take 10 mg by mouth daily with breakfast.     metFORMIN  (GLUCOPHAGE ) 1000 MG tablet Take by mouth 2 (two) times daily with a meal.     rosuvastatin (CRESTOR) 5 MG tablet Take 5 mg by mouth daily.     sacubitril -valsartan  (ENTRESTO ) 49-51 MG  Take 1 tablet by mouth 2 (two) times daily. 60 tablet 3   tadalafil (CIALIS) 5 MG tablet Take 5 mg by mouth daily.     No current facility-administered medications for this encounter.    Allergies  Allergen Reactions   Penicillins Other (See Comments)    Unsure of reaction.      Social History   Socioeconomic History   Marital status: Married    Spouse name: Andree   Number of children: 3   Years of education: 12   Highest education level: High school graduate  Occupational History   Occupation: chartered certified accountant  Tobacco Use   Smoking status: Never   Smokeless tobacco: Never  Vaping Use   Vaping status: Never Used  Substance and Sexual Activity   Alcohol use: Not Currently    Comment: occasionally - 1-2 drinks per month   Drug use: Never   Sexual activity: Yes  Other Topics Concern   Not on file  Social History Narrative   Lives with his wife and children.   Right-handed.   2-3 caffeine drinks per week.   Social Drivers of Corporate Investment Banker Strain: Not on file  Food Insecurity: No Food Insecurity (08/14/2022)   Hunger Vital Sign    Worried About Running Out of Food in the Last Year: Never true    Ran Out of Food in the Last Year: Never true  Transportation Needs: No Transportation Needs (08/14/2022)   PRAPARE -  Administrator, Civil Service (Medical): No    Lack of Transportation (Non-Medical): No  Physical Activity: Not on file  Stress: Not on file  Social Connections: Not on file  Intimate Partner Violence: Not At Risk (08/14/2022)   Humiliation, Afraid, Rape, and Kick questionnaire    Fear of Current or Ex-Partner: No    Emotionally Abused: No    Physically Abused: No    Sexually Abused: No      Family History  Problem Relation Age of Onset   Diabetes Mother    Hypertension Mother    Other Father        overdose   Diabetes Maternal Grandfather    Hypertension Maternal Grandfather     Vitals:   07/30/23 1519  BP: 112/78  Pulse: 88   SpO2: 100%  Weight: 85.3 kg (188 lb)    PHYSICAL EXAM: General:  Well appearing. No resp difficulty HEENT: normal Neck: supple. no JVD. Carotids 2+ bilat; no bruits. No lymphadenopathy or thryomegaly appreciated. Cor: PMI nondisplaced. Regular rate & rhythm. No rubs, gallops or murmurs. Lungs: clear Abdomen: soft, nontender, nondistended. No hepatosplenomegaly. No bruits or masses. Good bowel sounds. Extremities: no cyanosis, clubbing, rash, edema Neuro: alert & orientedx3, cranial nerves grossly intact. moves all 4 extremities w/o difficulty. Affect pleasant   ECG: Sinus inferolateral Qs 1AVB Personally reviewed   ASSESSMENT & PLAN:  1. 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 ECV elevated 46%. Diffuse delayed myocardial enhancement throughout the left ventricular myocardium, involving the right ventricle as well. - Exercise stress 8/24 There is a fixed mild defect in the inferior and apical regions possible suggesting diaphragmatic attenuation and apical thinning. Minimal ischemia/scar in this region cannot be excluded. EF 32% (suspect artifactual) - NYHA I-II - Volume status ok  - Continue Jardiance  10 mg daily - Continue Entresto  49/51 bid - Start eplerenone  12.5 check BMET 2 weeks  - w/u for infiltrative CM as below - will need repeat echo  2. 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  Genetic testing negative.  - Will plan PYP. If negative will potentially need biopsy to get a diagnosis here.   3. Benign hypertension - BP ok - adding eplerenone   4. DM2 - continue Jardiance   5. Abnormal stress test/ECG - concerning for underlying ischemic heart disease but no s/s angina - suspect may be false positive due to  infiltrative process - will need eventual cardiac CT - no change  Toribio Fuel, MD  3:48 PM

## 2023-07-30 NOTE — Patient Instructions (Signed)
 Start eplerenone  12.5 mg daily - Rx sent to your pharmacy. Please come for labs in 2 weeks - see below. We have ordered a PYP scan for you. We will obtain prior authorization from your insurance and call you to schedule. Please call us  if any questions or concerns prior to your next visit.    You have been ordered a PYP Scan.  This is done in the Radiology Department [facility].  When you come for this test please plan to be there 2-3 hours.

## 2023-08-02 ENCOUNTER — Telehealth (HOSPITAL_COMMUNITY): Payer: Self-pay

## 2023-08-02 NOTE — Telephone Encounter (Signed)
-----   Message from Elinda Guard sent at 08/02/2023  9:01 AM EST ----- Amyloid needs Prior auth to schedule. Happy Monday! :)

## 2023-08-08 NOTE — Addendum Note (Signed)
Encounter addended by: Dolores Patty, MD on: 08/08/2023 5:19 PM  Actions taken: Clinical Note Signed

## 2023-08-09 ENCOUNTER — Encounter (HOSPITAL_COMMUNITY): Payer: Self-pay

## 2023-08-13 ENCOUNTER — Ambulatory Visit (HOSPITAL_COMMUNITY)
Admission: RE | Admit: 2023-08-13 | Discharge: 2023-08-13 | Disposition: A | Discharge status: Home / Self Care | Payer: BC Managed Care – PPO | Source: Ambulatory Visit | Attending: Cardiology | Admitting: Cardiology

## 2023-08-13 DIAGNOSIS — I5033 Acute on chronic diastolic (congestive) heart failure: Secondary | ICD-10-CM | POA: Insufficient documentation

## 2023-08-13 LAB — BASIC METABOLIC PANEL
Anion gap: 10 (ref 5–15)
BUN: 18 mg/dL (ref 6–20)
CO2: 24 mmol/L (ref 22–32)
Calcium: 9 mg/dL (ref 8.9–10.3)
Chloride: 104 mmol/L (ref 98–111)
Creatinine, Ser: 1.34 mg/dL — ABNORMAL HIGH (ref 0.61–1.24)
GFR, Estimated: >60 mL/min (ref >60)
Glucose, Bld: 87 mg/dL (ref 70–99)
Potassium: 4.6 mmol/L (ref 3.5–5.1)
Sodium: 138 mmol/L (ref 135–145)

## 2023-08-23 ENCOUNTER — Telehealth (HOSPITAL_COMMUNITY): Payer: Self-pay | Admitting: Internal Medicine

## 2023-08-23 NOTE — Telephone Encounter (Signed)
 Just an FYI. We have made several attempts to contact this patient including sending a letter to schedule or reschedule their AMYLOID. We will be removing the patient from the echo/nuc WQ.   08/09/23 Sent Letter thru [facility] CHART/LBW 08/09/23 Called and NVM set up @ 9:31 08/03/23 called and NVM set up @ 12:37/LBW      Thank you

## 2023-09-23 ENCOUNTER — Encounter (HOSPITAL_COMMUNITY): Payer: Self-pay | Admitting: Internal Medicine

## 2023-09-23 ENCOUNTER — Ambulatory Visit (HOSPITAL_COMMUNITY)
Admission: RE | Admit: 2023-09-23 | Discharge: 2023-09-23 | Disposition: A | Discharge status: Home / Self Care | Payer: BC Managed Care – PPO | Source: Ambulatory Visit | Attending: Internal Medicine | Admitting: Internal Medicine

## 2023-09-23 VITALS — BP 114/78 | HR 91 | Ht 69.0 in | Wt 195.2 lb

## 2023-09-23 DIAGNOSIS — I5022 Chronic systolic (congestive) heart failure: Secondary | ICD-10-CM | POA: Diagnosis not present

## 2023-09-23 DIAGNOSIS — Z7984 Long term (current) use of oral hypoglycemic drugs: Secondary | ICD-10-CM | POA: Insufficient documentation

## 2023-09-23 DIAGNOSIS — I43 Cardiomyopathy in diseases classified elsewhere: Secondary | ICD-10-CM | POA: Insufficient documentation

## 2023-09-23 DIAGNOSIS — R9439 Abnormal result of other cardiovascular function study: Secondary | ICD-10-CM | POA: Insufficient documentation

## 2023-09-23 DIAGNOSIS — I10 Essential (primary) hypertension: Secondary | ICD-10-CM

## 2023-09-23 DIAGNOSIS — I110 Hypertensive heart disease with heart failure: Secondary | ICD-10-CM | POA: Diagnosis not present

## 2023-09-23 DIAGNOSIS — Z79899 Other long term (current) drug therapy: Secondary | ICD-10-CM | POA: Diagnosis not present

## 2023-09-23 DIAGNOSIS — I5032 Chronic diastolic (congestive) heart failure: Secondary | ICD-10-CM | POA: Diagnosis not present

## 2023-09-23 DIAGNOSIS — E119 Type 2 diabetes mellitus without complications: Secondary | ICD-10-CM | POA: Insufficient documentation

## 2023-09-23 DIAGNOSIS — I428 Other cardiomyopathies: Secondary | ICD-10-CM

## 2023-09-23 NOTE — Progress Notes (Signed)
 ADVANCED HF CLINIC NOTE  Referring Physician: Aliene Beams, MD Primary Care: Aliene Beams, MD Primary Cardiologist: Tessa Lerner, DO  Chief Complaint: Cardiac amyloidosis  HPI:  Michael Shannon is a 48 y.o. male with severe HTN, DM2 who is referred by Dr. Odis Hollingshead for further evaluation of his infiltrative cardiomyopathy.   Dx'd with acute diastolic HF in 2/24 in setting of severe HTN. Work-up as below.   - Echo 2/24 EF 55-60% moderate LVH - cMRI 8/24 LVEF 50% Severe LVH ECV 46% Findings most consistent with cardiac amyloidosis. ECV elevated 46%. Diffuse delayed myocardial enhancement throughout the left ventricular myocardium, involving the right ventricle as well. - Exercise stress 8/24 There is a fixed mild defect in the inferior and apical regions possible suggesting diaphragmatic attenuation and apical thinning. Minimal ischemia/scar in this region cannot be excluded. EF 32%  Had SPEP which was negative for M-spike. Genetic testing negative for TTR  Here for routine f/u. Continues to work [employer] for [employer]. No CP, SOB, orthopnea or PND. Slight swelling. At last visit we ordered a repeat echo and PYP scan but these have not been completed yet   FHx: Mother HTN P Aunt HTN M Uncle with HF PGF HTN     Past Medical History:  Diagnosis Date   Diabetes mellitus without complication (HCC)    Hypertension    Pain     Current Outpatient Medications  Medication Sig Dispense Refill   atorvastatin (LIPITOR) 20 MG tablet Take 20 mg by mouth daily.     empagliflozin (JARDIANCE) 25 MG TABS tablet Take 1 tablet (25 mg total) by mouth daily before breakfast. 30 tablet 11   eplerenone (INSPRA) 25 MG tablet Take 0.5 tablets (12.5 mg total) by mouth daily. 45 tablet 3   furosemide (LASIX) 20 MG tablet Take 20 mg by mouth daily.     glipiZIDE (GLUCOTROL XL) 10 MG 24 hr tablet Take 10 mg by mouth daily with breakfast.     metFORMIN (GLUCOPHAGE) 1000 MG tablet Take by  mouth 2 (two) times daily with a meal.     rosuvastatin (CRESTOR) 5 MG tablet Take 5 mg by mouth daily.     sacubitril-valsartan (ENTRESTO) 49-51 MG Take 1 tablet by mouth 2 (two) times daily. 60 tablet 3   tadalafil (CIALIS) 5 MG tablet Take 5 mg by mouth daily.     No current facility-administered medications for this encounter.    Allergies  Allergen Reactions   Penicillins Other (See Comments)    Unsure of reaction.      Social History   Socioeconomic History   Marital status: Married    Spouse name: Sheralyn Boatman   Number of children: 3   Years of education: 12   Highest education level: High school graduate  Occupational History   Occupation: Chartered certified accountant  Tobacco Use   Smoking status: Never   Smokeless tobacco: Never  Vaping Use   Vaping status: Never Used  Substance and Sexual Activity   Alcohol use: Not Currently    Comment: occasionally - 1-2 drinks per month   Drug use: Never   Sexual activity: Yes  Other Topics Concern   Not on file  Social History Narrative   Lives with his wife and children.   Right-handed.   2-3 caffeine drinks per week.   Social Drivers of Corporate investment banker Strain: Not on file  Food Insecurity: No Food Insecurity (08/14/2022)   Hunger Vital Sign    Worried About Running  Out of Food in the Last Year: Never true    Ran Out of Food in the Last Year: Never true  Transportation Needs: No Transportation Needs (08/14/2022)   PRAPARE - Administrator, Civil Service (Medical): No    Lack of Transportation (Non-Medical): No  Physical Activity: Not on file  Stress: Not on file  Social Connections: Not on file  Intimate Partner Violence: Not At Risk (08/14/2022)   Humiliation, Afraid, Rape, and Kick questionnaire    Fear of Current or Ex-Partner: No    Emotionally Abused: No    Physically Abused: No    Sexually Abused: No      Family History  Problem Relation Age of Onset   Diabetes Mother    Hypertension Mother    Other  Father        overdose   Diabetes Maternal Grandfather    Hypertension Maternal Grandfather     Vitals:   09/23/23 1455  BP: 114/78  Pulse: 91  SpO2: 98%  Weight: 88.5 kg (195 lb 3.2 oz)  Height: 5\' 9"  (1.753 m)    PHYSICAL EXAM: General:  Well appearing. No resp difficulty HEENT: normal Neck: supple. no JVD. Carotids 2+ bilat; no bruits. No lymphadenopathy or thryomegaly appreciated. Cor: PMI nondisplaced. Regular rate & rhythm. No rubs, gallops or murmurs. Lungs: clear Abdomen: soft, nontender, nondistended. No hepatosplenomegaly. No bruits or masses. Good bowel sounds. Extremities: no cyanosis, clubbing, rash, edema Neuro: alert & orientedx3, cranial nerves grossly intact. moves all 4 extremities w/o difficulty. Affect pleasant   ASSESSMENT & PLAN:  1. 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 ECV elevated 46%. Diffuse delayed myocardial enhancement throughout the left ventricular myocardium, involving the right ventricle as well. - Exercise stress 8/24 There is a fixed mild defect in the inferior and apical regions possible suggesting diaphragmatic attenuation and apical thinning. Minimal ischemia/scar in this region cannot be excluded. EF 32% (suspect artifactual) - Doing well NYHA I - Volume status looks good - Continue Jardiance 10 mg daily - Continue Entresto 49/51 bid - Continue eplerenone 12.5 check  - Plan for echo and PYP  2. 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  Genetic testing negative.  - Echo and PYP reordered today  3. Benign hypertension - Blood pressure well controlled. Continue current regimen.  4. DM2 - continue Jardiance  5. Abnormal stress test/ECG - concerning for underlying ischemic heart  disease but no s/s angina - suspect may be false positive due to infiltrative process - will need eventual cardiac CT - no change  Arvilla Meres, MD  3:36 PM

## 2023-09-23 NOTE — Patient Instructions (Signed)
 Great to see you today!!!  Medication Changes:  None, continue current medications  Testing/Procedures:  Your physician has requested that you have an echocardiogram. Echocardiography is a painless test that uses sound waves to create images of your heart. It provides your doctor with information about the size and shape of your heart and how well your heart's chambers and valves are working. This procedure takes approximately one hour. There are no restrictions for this procedure. Please do NOT wear cologne, perfume, aftershave, or lotions (deodorant is allowed). Please arrive 15 minutes prior to your appointment time.  Please note: We ask at that you not bring children with you during ultrasound (echo/ vascular) testing. Due to room size and safety concerns, children are not allowed in the ultrasound rooms during exams. Our front office staff cannot provide observation of children in our lobby area while testing is being conducted. An adult accompanying a patient to their appointment will only be allowed in the ultrasound room at the discretion of the ultrasound technician under special circumstances. We apologize for any inconvenience.  You have been ordered a PYP Scan.  This is done at [facility], they will call you to schedule.  When you come for this test please plan to be there 2-3 hours.  They are located at: [address] [place], [place] [address] THERE IS CURRENTLY A SHORTAGE OF THE TRACER NEEDED FOR THIS TEST, WHEN RESTOCKED THEY WILL CALL YOU TO SCHEDULE   Special Instructions // Education:  Do the following things EVERYDAY: Weigh yourself in the morning before breakfast. Write it down and keep it in a log. Take your medicines as prescribed Eat low salt foods--Limit salt (sodium) to 2000 mg per day.  Stay as active as you can everyday Limit all fluids for the day to less than 2 liters   Follow-Up in: 4 months   At the [facility], you and your health  needs are our priority. We have a designated team specialized in the treatment of Heart Failure. This Care Team includes your primary Heart Failure Specialized Cardiologist (physician), Advanced Practice Providers (APPs- Physician Assistants and Nurse Practitioners), and Pharmacist who all work together to provide you with the care you need, when you need it.   You may see any of the following providers on your designated Care Team at your next follow up:  Dr. Arvilla Meres Dr. Marca Ancona Dr. Dorthula Nettles Dr. Theresia Bough Tonye Becket, NP Robbie Lis, [place] [facility] [place], [place] Brynda Peon, NP [place] Lee, NP Karle Plumber, PharmD   Please be sure to bring in all your medications bottles to every appointment.   Need to Contact us:  If you have any questions or concerns before your next appointment please send [place] a message through [place] or call our office at [phone].    TO LEAVE A MESSAGE FOR THE NURSE SELECT OPTION 2, PLEASE LEAVE A MESSAGE INCLUDING: YOUR NAME DATE OF BIRTH CALL BACK NUMBER REASON FOR CALL**this is important as we prioritize the call backs  YOU WILL RECEIVE A CALL BACK THE SAME DAY AS LONG AS YOU CALL BEFORE 4:00 PM

## 2023-10-15 DIAGNOSIS — E854 Organ-limited amyloidosis: Secondary | ICD-10-CM | POA: Diagnosis not present

## 2023-10-15 DIAGNOSIS — Z79899 Other long term (current) drug therapy: Secondary | ICD-10-CM | POA: Diagnosis not present

## 2023-10-15 DIAGNOSIS — E1165 Type 2 diabetes mellitus with hyperglycemia: Secondary | ICD-10-CM | POA: Diagnosis not present

## 2023-10-15 DIAGNOSIS — I5032 Chronic diastolic (congestive) heart failure: Secondary | ICD-10-CM | POA: Diagnosis not present

## 2023-10-15 DIAGNOSIS — E782 Mixed hyperlipidemia: Secondary | ICD-10-CM | POA: Diagnosis not present

## 2023-10-21 ENCOUNTER — Ambulatory Visit (HOSPITAL_COMMUNITY): Admission: RE | Admit: 2023-10-21 | Source: Ambulatory Visit

## 2023-11-09 ENCOUNTER — Other Ambulatory Visit (HOSPITAL_COMMUNITY): Payer: Self-pay | Admitting: Internal Medicine

## 2023-11-16 DIAGNOSIS — Z125 Encounter for screening for malignant neoplasm of prostate: Secondary | ICD-10-CM | POA: Diagnosis not present

## 2023-11-16 DIAGNOSIS — E291 Testicular hypofunction: Secondary | ICD-10-CM | POA: Diagnosis not present

## 2023-11-18 ENCOUNTER — Other Ambulatory Visit (HOSPITAL_COMMUNITY): Payer: Self-pay | Admitting: *Deleted

## 2023-11-18 MED ORDER — ENTRESTO 49-51 MG PO TABS
1.0000 | ORAL_TABLET | Freq: Two times a day (BID) | ORAL | 6 refills | Status: DC
Start: 2023-11-18 — End: 2024-03-17

## 2023-11-22 DIAGNOSIS — N529 Male erectile dysfunction, unspecified: Secondary | ICD-10-CM | POA: Diagnosis not present

## 2023-11-22 DIAGNOSIS — E291 Testicular hypofunction: Secondary | ICD-10-CM | POA: Diagnosis not present

## 2023-11-22 DIAGNOSIS — R6882 Decreased libido: Secondary | ICD-10-CM | POA: Diagnosis not present

## 2023-12-15 DIAGNOSIS — N529 Male erectile dysfunction, unspecified: Secondary | ICD-10-CM | POA: Diagnosis not present

## 2024-01-24 DIAGNOSIS — I5032 Chronic diastolic (congestive) heart failure: Secondary | ICD-10-CM | POA: Diagnosis not present

## 2024-01-24 DIAGNOSIS — I10 Essential (primary) hypertension: Secondary | ICD-10-CM | POA: Diagnosis not present

## 2024-01-24 DIAGNOSIS — E1165 Type 2 diabetes mellitus with hyperglycemia: Secondary | ICD-10-CM | POA: Diagnosis not present

## 2024-01-24 DIAGNOSIS — R1110 Vomiting, unspecified: Secondary | ICD-10-CM | POA: Diagnosis not present

## 2024-01-27 DIAGNOSIS — R945 Abnormal results of liver function studies: Secondary | ICD-10-CM | POA: Diagnosis not present

## 2024-02-03 DIAGNOSIS — R945 Abnormal results of liver function studies: Secondary | ICD-10-CM | POA: Diagnosis not present

## 2024-02-03 DIAGNOSIS — R7989 Other specified abnormal findings of blood chemistry: Secondary | ICD-10-CM | POA: Diagnosis not present

## 2024-02-10 ENCOUNTER — Encounter (HOSPITAL_COMMUNITY): Payer: Self-pay

## 2024-02-15 ENCOUNTER — Encounter (HOSPITAL_COMMUNITY): Payer: Self-pay | Admitting: Internal Medicine

## 2024-02-16 DIAGNOSIS — E1169 Type 2 diabetes mellitus with other specified complication: Secondary | ICD-10-CM | POA: Diagnosis not present

## 2024-02-16 DIAGNOSIS — Z79899 Other long term (current) drug therapy: Secondary | ICD-10-CM | POA: Diagnosis not present

## 2024-02-16 DIAGNOSIS — I10 Essential (primary) hypertension: Secondary | ICD-10-CM | POA: Diagnosis not present

## 2024-02-16 DIAGNOSIS — Z0000 Encounter for general adult medical examination without abnormal findings: Secondary | ICD-10-CM | POA: Diagnosis not present

## 2024-02-16 DIAGNOSIS — I5032 Chronic diastolic (congestive) heart failure: Secondary | ICD-10-CM | POA: Diagnosis not present

## 2024-02-16 DIAGNOSIS — E782 Mixed hyperlipidemia: Secondary | ICD-10-CM | POA: Diagnosis not present

## 2024-03-08 DIAGNOSIS — Z1211 Encounter for screening for malignant neoplasm of colon: Secondary | ICD-10-CM | POA: Diagnosis not present

## 2024-03-17 ENCOUNTER — Telehealth (HOSPITAL_COMMUNITY): Payer: Self-pay

## 2024-03-17 MED ORDER — FUROSEMIDE 20 MG PO TABS: 40.0000 mg | ORAL_TABLET | Freq: Every day | ORAL | 5 refills | Status: DC

## 2024-03-17 MED ORDER — POTASSIUM CHLORIDE CRYS ER 20 MEQ PO TBCR: 40.0000 meq | EXTENDED_RELEASE_TABLET | Freq: Every day | ORAL | 5 refills | Status: DC

## 2024-03-17 MED ORDER — SACUBITRIL-VALSARTAN 97-103 MG PO TABS: 1.0000 | ORAL_TABLET | Freq: Two times a day (BID) | ORAL | 5 refills | Status: AC

## 2024-03-17 NOTE — Addendum Note (Signed)
 Addended by: ALVAN COPA R on: 03/17/2024 05:09 PM   Modules accepted: Orders

## 2024-03-17 NOTE — Telephone Encounter (Signed)
 Patient called stating that he has been experiencing waist down swelling, fatigue, bloating for the past 2 weeks and nausea on Tuesday. He denies chest/arm pain,headaches,vomiting. He did report that he has missed around 3-4 days worth of medications due to not feeling well. His current vitals are below. Please advise.    [id number] Blood Pressure on 03/16/24 162/109 HR 92

## 2024-03-17 NOTE — Telephone Encounter (Signed)
 Patient advised and verbalized understanding,appointment scheduled. Rx sent.

## 2024-03-20 ENCOUNTER — Ambulatory Visit (HOSPITAL_COMMUNITY): Payer: Self-pay | Admitting: Family Medicine

## 2024-03-20 ENCOUNTER — Encounter (HOSPITAL_COMMUNITY): Payer: Self-pay

## 2024-03-20 ENCOUNTER — Ambulatory Visit (HOSPITAL_COMMUNITY)
Admission: RE | Admit: 2024-03-20 | Discharge: 2024-03-20 | Disposition: A | Source: Ambulatory Visit | Attending: Family Medicine

## 2024-03-20 ENCOUNTER — Other Ambulatory Visit (HOSPITAL_COMMUNITY): Payer: Self-pay | Admitting: Family Medicine

## 2024-03-20 VITALS — BP 148/100 | HR 103 | Ht 69.0 in | Wt 210.0 lb

## 2024-03-20 DIAGNOSIS — Z79899 Other long term (current) drug therapy: Secondary | ICD-10-CM | POA: Diagnosis not present

## 2024-03-20 DIAGNOSIS — I5032 Chronic diastolic (congestive) heart failure: Secondary | ICD-10-CM | POA: Insufficient documentation

## 2024-03-20 DIAGNOSIS — I428 Other cardiomyopathies: Secondary | ICD-10-CM | POA: Diagnosis not present

## 2024-03-20 DIAGNOSIS — Z7984 Long term (current) use of oral hypoglycemic drugs: Secondary | ICD-10-CM | POA: Diagnosis not present

## 2024-03-20 DIAGNOSIS — I10 Essential (primary) hypertension: Secondary | ICD-10-CM

## 2024-03-20 DIAGNOSIS — R9439 Abnormal result of other cardiovascular function study: Secondary | ICD-10-CM | POA: Insufficient documentation

## 2024-03-20 DIAGNOSIS — R9431 Abnormal electrocardiogram [ECG] [EKG]: Secondary | ICD-10-CM

## 2024-03-20 DIAGNOSIS — I110 Hypertensive heart disease with heart failure: Secondary | ICD-10-CM | POA: Insufficient documentation

## 2024-03-20 DIAGNOSIS — R0602 Shortness of breath: Secondary | ICD-10-CM | POA: Diagnosis not present

## 2024-03-20 DIAGNOSIS — E119 Type 2 diabetes mellitus without complications: Secondary | ICD-10-CM | POA: Diagnosis not present

## 2024-03-20 DIAGNOSIS — Z139 Encounter for screening, unspecified: Secondary | ICD-10-CM

## 2024-03-20 LAB — COMPREHENSIVE METABOLIC PANEL WITH GFR
ALT: 77 U/L — ABNORMAL HIGH (ref 0–44)
AST: 37 U/L (ref 15–41)
Albumin: 3.3 g/dL — ABNORMAL LOW (ref 3.5–5.0)
Alkaline Phosphatase: 65 U/L (ref 38–126)
Anion gap: 12 (ref 5–15)
BUN: 22 mg/dL — ABNORMAL HIGH (ref 6–20)
CO2: 26 mmol/L (ref 22–32)
Calcium: 9 mg/dL (ref 8.9–10.3)
Chloride: 97 mmol/L — ABNORMAL LOW (ref 98–111)
Creatinine, Ser: 1.23 mg/dL (ref 0.61–1.24)
GFR, Estimated: >60 mL/min (ref >60)
Glucose, Bld: 141 mg/dL — ABNORMAL HIGH (ref 70–99)
Potassium: 5 mmol/L (ref 3.5–5.1)
Sodium: 135 mmol/L (ref 135–145)
Total Bilirubin: 1 mg/dL (ref 0.0–1.2)
Total Protein: 5.8 g/dL — ABNORMAL LOW (ref 6.5–8.1)

## 2024-03-20 LAB — BRAIN NATRIURETIC PEPTIDE: B Natriuretic Peptide: 3118.6 pg/mL — ABNORMAL HIGH (ref 0.0–100.0)

## 2024-03-20 MED ORDER — TORSEMIDE 20 MG PO TABS: 60.0000 mg | ORAL_TABLET | Freq: Every day | ORAL | 3 refills | Status: DC

## 2024-03-20 MED ORDER — TORSEMIDE 60 MG PO TABS
60.0000 mg | ORAL_TABLET | Freq: Every day | ORAL | 2 refills | Status: DC
Start: 2024-03-20 — End: 2024-03-20

## 2024-03-20 NOTE — Addendum Note (Signed)
 Encounter addended by: Shifra Swartzentruber M, RN on: 03/20/2024 3:27 PM  Actions taken: Medication long-term status modified, Order list changed

## 2024-03-20 NOTE — Progress Notes (Signed)
 ADVANCED HF CLINIC NOTE   Primary Care: Rolinda Millman, MD Primary Cardiologist: Dr. Michele HF Cardiologist: Dr. Cherrie  HPI: Michael Shannon is a 48 y.o. male with severe HTN, DM2 who was referred by Dr. Michele for further evaluation of his infiltrative cardiomyopathy.   Dx'd with acute diastolic HF in 2/24 in setting of severe HTN. Work-up as below.   - Echo 2/24 EF 55-60% moderate LVH  - cMRI 8/24 LVEF 50% Severe LVH ECV 46% Findings most consistent with cardiac amyloidosis. ECV elevated 46%. Diffuse delayed myocardial enhancement throughout the left ventricular myocardium, involving the right ventricle as well.  - Exercise stress 8/24 There is a fixed mild defect in the inferior and apical regions possible suggesting diaphragmatic attenuation and apical thinning. Minimal ischemia/scar in this region cannot be excluded. EF 32%  Had SPEP which was negative for M-spike. Genetic testing negative for TTR  Today he returns for HF follow up. Overall feeling fair. Called office last week with increased swelling, dyspnea and elevated BP. We instructed him to increase Entresto  to 97/103, and increase Lasix  40 bid x 3 days, then 40 mg daily thereafter. Breathing more labored. Mild SOB walking up steps. Has LEE. Denies palpitations, abnormal bleeding, CP, dizziness, or PND/Orthopnea. Appetite ok. Weight at home 210 pounds. Off Jardiance  x months, and currently taking spiro and Inspra  for unclear reasons. Works full time [employer] officer. Echo and PYP ordered but not completed. No tobacco, ETOH or drugs.   FHx: Mother HTN P Aunt HTN M Uncle with HF PGF HTN     Past Medical History:  Diagnosis Date   Diabetes mellitus without complication (HCC)    Hypertension    Pain    Current Outpatient Medications  Medication Sig Dispense Refill   eplerenone  (INSPRA ) 25 MG tablet Take 0.5 tablets (12.5 mg total) by mouth daily. 45 tablet 3   furosemide  (LASIX ) 20 MG tablet Take 2  tablets (40 mg total) by mouth daily. 60 tablet 5   glipiZIDE (GLUCOTROL XL) 10 MG 24 hr tablet Take 10 mg by mouth daily with breakfast.     metFORMIN  (GLUCOPHAGE ) 1000 MG tablet Take by mouth 2 (two) times daily with a meal.     potassium chloride  SA (KLOR-CON  M) 20 MEQ tablet Take 2 tablets (40 mEq total) by mouth daily. 60 tablet 5   rosuvastatin (CRESTOR) 5 MG tablet Take 5 mg by mouth daily.     sacubitril -valsartan  (ENTRESTO ) 97-103 MG Take 1 tablet by mouth 2 (two) times daily. 60 tablet 5   atorvastatin (LIPITOR) 20 MG tablet Take 20 mg by mouth daily. (Patient not taking: Reported on 03/20/2024)     empagliflozin  (JARDIANCE ) 25 MG TABS tablet Take 1 tablet (25 mg total) by mouth daily before breakfast. (Patient not taking: Reported on 03/20/2024) 30 tablet 11   tadalafil (CIALIS) 5 MG tablet Take 5 mg by mouth daily. (Patient not taking: Reported on 03/20/2024)     No current facility-administered medications for this encounter.   Allergies  Allergen Reactions   Penicillins Other (See Comments)    Unsure of reaction.   Social History   Socioeconomic History   Marital status: Married    Spouse name: Andree   Number of children: 3   Years of education: 12   Highest education level: High school graduate  Occupational History   Occupation: Chartered certified accountant  Tobacco Use   Smoking status: Never   Smokeless tobacco: Never  Vaping Use   Vaping status: Never  Used  Substance and Sexual Activity   Alcohol use: Not Currently    Comment: occasionally - 1-2 drinks per month   Drug use: Never   Sexual activity: Yes  Other Topics Concern   Not on file  Social History Narrative   Lives with his wife and children.   Right-handed.   2-3 caffeine drinks per week.   Social Drivers of Corporate investment banker Strain: Not on file  Food Insecurity: No Food Insecurity (08/14/2022)   Hunger Vital Sign    Worried About Running Out of Food in the Last Year: Never true    Ran Out of Food in the  Last Year: Never true  Transportation Needs: No Transportation Needs (08/14/2022)   PRAPARE - Administrator, Civil Service (Medical): No    Lack of Transportation (Non-Medical): No  Physical Activity: Not on file  Stress: Not on file  Social Connections: Not on file  Intimate Partner Violence: Not At Risk (08/14/2022)   Humiliation, Afraid, Rape, and Kick questionnaire    Fear of Current or Ex-Partner: No    Emotionally Abused: No    Physically Abused: No    Sexually Abused: No   Family History  Problem Relation Age of Onset   Diabetes Mother    Hypertension Mother    Other Father        overdose   Diabetes Maternal Grandfather    Hypertension Maternal Grandfather    Wt Readings from Last 3 Encounters:  03/20/24 95.3 kg (210 lb)  09/23/23 88.5 kg (195 lb 3.2 oz)  07/30/23 85.3 kg (188 lb)   BP (!) 148/100   Pulse (!) 103   Ht 5' 9 (1.753 m)   Wt 95.3 kg (210 lb)   SpO2 98%   BMI 31.01 kg/m   PHYSICAL EXAM: General:  NAD. No resp difficulty, walked into clinic HEENT: Normal Neck: Supple. JVP to jaw Cor: Tachy regular rate & rhythm. No rubs, gallops or murmurs. Lungs: Clear Abdomen: Soft, obese, nontender, nondistended.  Extremities: No cyanosis, clubbing, rash, 3+ BLE edema to knees Neuro: Alert & oriented x 3, moves all 4 extremities w/o difficulty. Affect pleasant.  ReDs reading: 44 %, abnormal  ECG (personally reviewed): NSR 97 bpm  ASSESSMENT & PLAN: 1. 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 ECV elevated 46%. Diffuse delayed myocardial enhancement throughout the left ventricular myocardium, involving the right ventricle as well. - Exercise stress 8/24 There is a fixed mild defect in the inferior and apical regions possible suggesting diaphragmatic attenuation and  apical thinning. Minimal ischemia/scar in this region cannot be excluded. EF 32% (suspect artifactual) - NYHA II-early III. Markedly volume overloaded today, ReDs 44% and weight up 15 lbs  - Restart Jardiance  25 mg daily. - Stop Lasix  - Start torsemide 60 mg daily - Continue 40 KCL daily. - Continue Entresto  97/103 mg bid. - Continue eplerenone  12.5 mg daily. Stop spiro - Plan for echo and PYP - Labs today.  2. 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 Genetic testing negative.  - Echo and PYP reordered today  3. Benign hypertension - BP elevated - Meds as above - Consider Bidil next  4. DM2 - Restart Jardiance   - BMET today  5. Abnormal stress test/ECG -  Concerning for underlying ischemic heart disease but no s/s angina - suspect may be false positive due to infiltrative process - Will need eventual cardiac CT - No change  6. SDOH - Poor health literacy - appears to need help with medications - Discuss paramedicine next visit  Follow up in 1-2 weeks with APP. He is high risk for admission  Harlene CHRISTELLA Gainer, [place]  03/20/24 1:52 PM

## 2024-03-20 NOTE — Patient Instructions (Addendum)
 Good to see you today!  STOP Spironolactone   STOP Lasix   START Torsemide 60 mg daily  RESTART Jardiance  25 mg daily  You have been ordered a PYP Scan.  This is done in the Radiology Department of [facility].  When you come for this test please plan to be there 2-3 hours.  Your physician has requested that you have an echocardiogram. Echocardiography is a painless test that uses sound waves to create images of your heart. It provides your doctor with information about the size and shape of your heart and how well your heart's chambers and valves are working. This procedure takes approximately one hour. There are no restrictions for this procedure. Please do NOT wear cologne, perfume, aftershave, or lotions (deodorant is allowed). Please arrive 15 minutes prior to your appointment time.  Please note: We ask at that you not bring children with you during ultrasound (echo/ vascular) testing. Due to room size and safety concerns, children are not allowed in the ultrasound rooms during exams. Our front office staff cannot provide observation of children in our lobby area while testing is being conducted. An adult accompanying a patient to their appointment will only be allowed in the ultrasound room at the discretion of the ultrasound technician under special circumstances. We apologize for any inconvenience.  Labs done today, your results will be available in MyChart, we will contact you for abnormal readings.  Your physician recommends that you schedule a follow-up appointment   If you have any questions or concerns before your next appointment please send us  a message through [place] or call our office at [phone].    TO LEAVE A MESSAGE FOR THE NURSE SELECT OPTION 2, PLEASE LEAVE A MESSAGE INCLUDING: YOUR NAME DATE OF BIRTH CALL BACK NUMBER REASON FOR CALL**this is important as we prioritize the call backs  YOU WILL RECEIVE A CALL BACK THE SAME DAY AS LONG AS YOU CALL BEFORE  4:00 PM At the [facility], you and your health needs are our priority. As part of our continuing mission to provide you with exceptional heart care, we have created designated Provider Care Teams. These Care Teams include your primary Cardiologist (physician) and Advanced Practice Providers (APPs- Physician Assistants and Nurse Practitioners) who all work together to provide you with the care you need, when you need it.   You may see any of the following providers on your designated Care Team at your next follow up: Dr Toribio Fuel Dr Ezra Shuck Dr. Ria Commander Dr. Morene Brownie Amy Lenetta, NP Caffie Shed, [place] [facility] [place], [place] Beckey Coe, NP [place] Lee, NP Ellouise Class, NP Tinnie Redman, PharmD Jaun Bash, PharmD   Please be sure to bring in all your medications bottles to every appointment.    Thank you for choosing [facility]

## 2024-03-20 NOTE — Progress Notes (Signed)
 ReDS Vest / Clip - 03/20/24 1326       ReDS Vest / Clip   Station Marker D    Ruler Value 30    ReDS Value Range High volume overload    ReDS Actual Value 44

## 2024-03-22 ENCOUNTER — Encounter (HOSPITAL_COMMUNITY): Payer: Self-pay

## 2024-03-24 ENCOUNTER — Other Ambulatory Visit (HOSPITAL_COMMUNITY): Payer: Self-pay | Admitting: Family Medicine

## 2024-03-24 DIAGNOSIS — I5032 Chronic diastolic (congestive) heart failure: Secondary | ICD-10-CM

## 2024-03-29 ENCOUNTER — Ambulatory Visit (HOSPITAL_COMMUNITY)
Admission: RE | Admit: 2024-03-29 | Discharge: 2024-03-29 | Disposition: A | Discharge status: Home / Self Care | Source: Ambulatory Visit | Attending: Cardiology | Admitting: Cardiology

## 2024-03-29 DIAGNOSIS — I5032 Chronic diastolic (congestive) heart failure: Secondary | ICD-10-CM

## 2024-03-29 MED ORDER — TECHNETIUM TC 99M PYROPHOSPHATE
20.5000 | Freq: Once | INTRAVENOUS | Status: AC
  Administered 2024-03-29: 20.5 via INTRAVENOUS

## 2024-03-29 NOTE — Progress Notes (Signed)
 ADVANCED HF CLINIC NOTE   Primary Care: Rolinda Millman, MD Primary Cardiologist: Dr. Michele HF Cardiologist: Dr. Cherrie  HPI: Michael Shannon is a 48 y.o. male with severe HTN, DM2 who was referred by Dr. Michele for further evaluation of his infiltrative cardiomyopathy.   Dx'd with acute diastolic HF in 2/24 in setting of severe HTN. Work-up as below.   - Echo 2/24 EF 55-60% moderate LVH  - cMRI 8/24 LVEF 50% Severe LVH ECV 46% Findings most consistent with cardiac amyloidosis. ECV elevated 46%. Diffuse delayed myocardial enhancement throughout the left ventricular myocardium, involving the right ventricle as well.  - Exercise stress 8/24 There is a fixed mild defect in the inferior and apical regions possible suggesting diaphragmatic attenuation and apical thinning. Minimal ischemia/scar in this region cannot be excluded. EF 32%  Had SPEP which was negative for M-spike. Genetic testing negative for TTR.  Today he returns for HF follow up. Overall feeling fine. No SOB with activity. Swelling is gone. Denies palpitations, abnormal bleeding, CP, dizziness, or PND/Orthopnea. Appetite ok. Weight at home 181 pounds. Taking all medications, needs to pick up Jardiance  from pharmacy. Works at [employer], full time Merchant navy officer. No ETOH, drugs or tobacco use.   FHx: Mother HTN P Aunt HTN M Uncle with HF PGF HTN   Past Medical History:  Diagnosis Date   Diabetes mellitus without complication (HCC)    Hypertension    Pain    Current Outpatient Medications  Medication Sig Dispense Refill   eplerenone  (INSPRA ) 25 MG tablet Take 0.5 tablets (12.5 mg total) by mouth daily. 45 tablet 3   glipiZIDE (GLUCOTROL XL) 10 MG 24 hr tablet Take 10 mg by mouth daily with breakfast.     metFORMIN  (GLUCOPHAGE ) 1000 MG tablet Take by mouth 2 (two) times daily with a meal.     potassium chloride  SA (KLOR-CON  M) 20 MEQ tablet Take 2 tablets (40 mEq total) by mouth daily. 60 tablet 5    rosuvastatin (CRESTOR) 5 MG tablet Take 5 mg by mouth daily.     sacubitril -valsartan  (ENTRESTO ) 97-103 MG Take 1 tablet by mouth 2 (two) times daily. 60 tablet 5   torsemide (DEMADEX) 20 MG tablet Take 3 tablets (60 mg total) by mouth daily. 90 tablet 3   atorvastatin (LIPITOR) 20 MG tablet Take 20 mg by mouth daily. (Patient not taking: Reported on 04/03/2024)     empagliflozin  (JARDIANCE ) 25 MG TABS tablet Take 1 tablet (25 mg total) by mouth daily before breakfast. (Patient not taking: Reported on 04/03/2024) 30 tablet 11   tadalafil (CIALIS) 5 MG tablet Take 5 mg by mouth daily. (Patient not taking: Reported on 04/03/2024)     No current facility-administered medications for this encounter.   Allergies  Allergen Reactions   Penicillins Other (See Comments)    Unsure of reaction.   Social History   Socioeconomic History   Marital status: Married    Spouse name: Andree   Number of children: 3   Years of education: 12   Highest education level: High school graduate  Occupational History   Occupation: Chartered certified accountant  Tobacco Use   Smoking status: Never   Smokeless tobacco: Never  Vaping Use   Vaping status: Never Used  Substance and Sexual Activity   Alcohol use: Not Currently    Comment: occasionally - 1-2 drinks per month   Drug use: Never   Sexual activity: Yes  Other Topics Concern   Not on file  Social History  Narrative   Lives with his wife and children.   Right-handed.   2-3 caffeine drinks per week.   Social Drivers of Corporate investment banker Strain: Not on file  Food Insecurity: No Food Insecurity (08/14/2022)   Hunger Vital Sign    Worried About Running Out of Food in the Last Year: Never true    Ran Out of Food in the Last Year: Never true  Transportation Needs: No Transportation Needs (08/14/2022)   PRAPARE - Administrator, Civil Service (Medical): No    Lack of Transportation (Non-Medical): No  Physical Activity: Not on file  Stress: Not on file   Social Connections: Not on file  Intimate Partner Violence: Not At Risk (08/14/2022)   Humiliation, Afraid, Rape, and Kick questionnaire    Fear of Current or Ex-Partner: No    Emotionally Abused: No    Physically Abused: No    Sexually Abused: No   Family History  Problem Relation Age of Onset   Diabetes Mother    Hypertension Mother    Other Father        overdose   Diabetes Maternal Grandfather    Hypertension Maternal Grandfather    Wt Readings from Last 3 Encounters:  04/03/24 85.6 kg (188 lb 12.8 oz)  03/29/24 95.3 kg (210 lb)  03/20/24 95.3 kg (210 lb)   BP 106/84   Pulse (!) 101   Wt 85.6 kg (188 lb 12.8 oz)   SpO2 98%   BMI 27.88 kg/m   PHYSICAL EXAM: General:  NAD. No resp difficulty, walked into clinic HEENT: Normal Neck: Supple. No JVD. Cor: Regular rate & rhythm. No rubs, gallops or murmurs. Lungs: Clear Abdomen: Soft, nontender, nondistended.  Extremities: No cyanosis, clubbing, rash, edema Neuro: Alert & oriented x 3, moves all 4 extremities w/o difficulty. Affect pleasant.  ReDs reading: 34%, normal  ASSESSMENT & PLAN: 1. 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 ECV elevated 46%. Diffuse delayed myocardial enhancement throughout the left ventricular myocardium, involving the right ventricle as well. - Exercise stress 8/24 There is a fixed mild defect in the inferior and apical regions possible suggesting diaphragmatic attenuation and apical thinning. Minimal ischemia/scar in this region cannot be excluded. EF 32% (suspect artifactual) - Improved NYHA I, volume ok today, REDs 34%. Weight down 22 lbs - Restart Jardiance  25 mg daily. - Continue torsemide 60 mg daily - Continue 40 KCL daily. - Continue Entresto  97/103 mg bid. - Continue eplerenone  12.5 mg daily.  - Echo  scheduled for 04/17/24 - Labs today.  2. ? Cardiac amyloidosis/infiltrative disease - cMRI as above - suspicious for cardiac amyloid - SPEP negative. Genetic testing for hATTR negative.  - suspect TTR, however PYP 10/25 not suggestive of cardiac ATTR amyloidosis - Echo scheduled for 04/17/24 - ? Sarcoidosis vs HCM. Arrange genetic testing for HCM and common cardiomyopathies  - Arrange cardiac PET to evaluate for sarcoidosis.  - Will send alpha-gal to rule out Fabry's. Discussed with Dr. Rolan  3. Benign hypertension - BP well controlled - Meds as above  4. DM2 - Restart Jardiance   - BMET today  5. Abnormal stress test/ECG - Concerning for underlying ischemic heart disease but no s/s angina - suspect may be false positive due to infiltrative process - May need eventual cardiac CTA - No change  6. SDOH - Poor health literacy - Has improved med compliance - Can consider paramedicine as needed  Follow up in 3-4 months with Dr. Bensimhon  Aniaya Bacha M Mandel Seiden, FNP  04/03/24 3:34 PM

## 2024-03-31 ENCOUNTER — Telehealth (HOSPITAL_COMMUNITY): Payer: Self-pay

## 2024-03-31 ENCOUNTER — Other Ambulatory Visit (HOSPITAL_COMMUNITY): Payer: Self-pay

## 2024-03-31 MED ORDER — EMPAGLIFLOZIN 25 MG PO TABS: 25.0000 mg | ORAL_TABLET | Freq: Every day | ORAL | 11 refills | Status: AC

## 2024-03-31 NOTE — Telephone Encounter (Signed)
 Called to confirm/remind patient of their appointment at the [facility] on 04/03/24.   Appointment:   [] Confirmed  [] Left mess   [x] No answer/No voice mail  [] VM Full/unable to leave message  [] Phone not in service

## 2024-04-03 ENCOUNTER — Encounter (HOSPITAL_COMMUNITY): Payer: Self-pay

## 2024-04-03 ENCOUNTER — Ambulatory Visit (HOSPITAL_COMMUNITY): Payer: Self-pay | Admitting: Family Medicine

## 2024-04-03 ENCOUNTER — Ambulatory Visit (HOSPITAL_COMMUNITY)
Admission: RE | Admit: 2024-04-03 | Discharge: 2024-04-03 | Disposition: A | Discharge status: Home / Self Care | Source: Ambulatory Visit | Attending: Family Medicine | Admitting: Family Medicine

## 2024-04-03 ENCOUNTER — Other Ambulatory Visit (HOSPITAL_COMMUNITY): Payer: Self-pay | Admitting: Family Medicine

## 2024-04-03 VITALS — BP 106/84 | HR 101 | Wt 188.8 lb

## 2024-04-03 DIAGNOSIS — I5032 Chronic diastolic (congestive) heart failure: Secondary | ICD-10-CM | POA: Insufficient documentation

## 2024-04-03 DIAGNOSIS — R9431 Abnormal electrocardiogram [ECG] [EKG]: Secondary | ICD-10-CM

## 2024-04-03 DIAGNOSIS — E119 Type 2 diabetes mellitus without complications: Secondary | ICD-10-CM | POA: Insufficient documentation

## 2024-04-03 DIAGNOSIS — I428 Other cardiomyopathies: Secondary | ICD-10-CM

## 2024-04-03 DIAGNOSIS — E854 Organ-limited amyloidosis: Secondary | ICD-10-CM | POA: Diagnosis not present

## 2024-04-03 DIAGNOSIS — R9439 Abnormal result of other cardiovascular function study: Secondary | ICD-10-CM | POA: Diagnosis not present

## 2024-04-03 DIAGNOSIS — Z556 Problems related to health literacy: Secondary | ICD-10-CM | POA: Insufficient documentation

## 2024-04-03 DIAGNOSIS — I43 Cardiomyopathy in diseases classified elsewhere: Secondary | ICD-10-CM | POA: Diagnosis not present

## 2024-04-03 DIAGNOSIS — I10 Essential (primary) hypertension: Secondary | ICD-10-CM

## 2024-04-03 DIAGNOSIS — Z79899 Other long term (current) drug therapy: Secondary | ICD-10-CM | POA: Diagnosis not present

## 2024-04-03 DIAGNOSIS — I110 Hypertensive heart disease with heart failure: Secondary | ICD-10-CM | POA: Diagnosis not present

## 2024-04-03 DIAGNOSIS — Z8249 Family history of ischemic heart disease and other diseases of the circulatory system: Secondary | ICD-10-CM | POA: Insufficient documentation

## 2024-04-03 LAB — BASIC METABOLIC PANEL WITH GFR
Anion gap: 11 (ref 5–15)
BUN: 38 mg/dL — ABNORMAL HIGH (ref 6–20)
CO2: 28 mmol/L (ref 22–32)
Calcium: 9.1 mg/dL (ref 8.9–10.3)
Chloride: 93 mmol/L — ABNORMAL LOW (ref 98–111)
Creatinine, Ser: 1.66 mg/dL — ABNORMAL HIGH (ref 0.61–1.24)
GFR, Estimated: 51 mL/min — ABNORMAL LOW (ref >60)
Glucose, Bld: 145 mg/dL — ABNORMAL HIGH (ref 70–99)
Potassium: 5.6 mmol/L — ABNORMAL HIGH (ref 3.5–5.1)
Sodium: 132 mmol/L — ABNORMAL LOW (ref 135–145)

## 2024-04-03 LAB — BRAIN NATRIURETIC PEPTIDE: B Natriuretic Peptide: 342.6 pg/mL — ABNORMAL HIGH (ref 0.0–100.0)

## 2024-04-03 NOTE — Patient Instructions (Signed)
 START Michael Shannon once you pick it up.  Labs done today, your results will be available in MyChart, we will contact you for abnormal readings.  Genetic testing has been collected, this has to be sent to [place]  for processing and can take 1-2 weeks for us  to get results back.  We will let you know the results once reviewed by your provider.   Cardiac Sarcoidosis/Inflammation PET Scan Patient Instructions  Please report to Radiology at the [facility] Main Entrance 15 minutes early for your test.  [address] [place], [place] [address]  BRING FOOD DIARY WITH YOU TO THIS APPOINTMENT For 24 hours before the test: Do not exercise! Do not eat after 5 pm the day before your test! To make sure that your scanning results are accurate, you MUST follow the sarcoid prep meal diet starting the day before your PET scan. This diet involves eating no carbohydrates 24 hours before the test.  You will keep a log of all that you eat the day before your test. If you have questions or do not understand this diet, please call [phone] for more information. If you are unable to follow this diet, please discuss an alternative strategy with the coordinator.  If you are diabetic, continue your diabetes medications as usual on the day before until you begin to fast. NO DIABETES MEDICATIONS ONCE YOU BEGIN TO FAST. On the morning of your test, please wait until after your test to take your morning medications.  What foods can I eat the day before my test?  Drink only water or black coffee (WITHOUT sugar, artificial sweetener, cream, or milk). Eggs (prepared without milk or cheese)  Meat that is either broiled or pan fried in butter WITHOUT breading (chicken, [place], bacon, meat-only sausage, hamburger, steak, fish) Butter, salt & pepper What foods must I AVOID the day before my test?  Do not consume alcoholic beverages, sodas, fruit juice, coffee creamer, or sports drinks  Do not eat vegetables,  beans, nuts, fruits, juices, bread, grains, rice, pasta, potatoes, or any baked goods Do not eat dairy products (milk, cheese, etc.)  Do not eat mayonnaise, ketchup, tartar sauce, mustard, or other condiments Do not add sugar, artificial sweeteners, or Splenda (sucralose) to foods or drinks  Do not eat breaded foods (like fried chicken)  Do not eat sweets, candy, gum, sweetened cough drops, lozenges, or sugar  Do not eat sweetened, grilled, or cured meats or meat with carbohydrate-containing additives (some sausages, ham, sweetened bacon)  Suggested items for breakfast, lunch, or dinner:  Breakfast  3 to 5 fatty sausage links fried in butter. 3 to 5 bacon strips.  3 eggs pan fried in butter (no milk or cheese).  Lunch/Dinner  2 hamburger patties fried in butter. Chicken or fatty fish pan fried in butter. No breading. 8 oz. fatty steak pan fried in butter.  Beverages  Drink only water or black coffee. DO NOT ADD SUGAR, ARTIFICIAL SWEETENER, CREAM, OR MILK  Please call [facility] to cancel or reschedule your appointment at [phone] For more information and frequently asked questions, please visit our website : [email or web address]   Cardiac Sarcoidosis/Inflammation PET Scan  Food Diary Name: _____________________________ Please fill in EXACTLY what you have eaten and when for 24 hours PRIOR to your test date.  Time Food/Drink Comments  Breakfast                Lunch  Dinner                Snacks                 DO NOT EXERCISE THE DAY BEFORE YOUR TEST DO NOT EAT AFTER 5 PM THE DAY BEFORE YOUR TEST.  ON THE DAY OF YOUR TEST, DO NOT EAT ANY FOOD AND ONLY DRINK CLEAR WATER! PLEASE BRING THIS FOOD DIARY WITH YOU TO YOUR APPOINTMENT  Your physician recommends that you schedule a follow-up appointment in: 3 months ( January 2026) ** PLEASE CALL THE OFFICE IN NOVEMBER TO ARRANGE YOUR FOLLOW UP APPOINTMENT.**  If you have any  questions or concerns before your next appointment please send us  a message through [employer] or call our office at [phone].    TO LEAVE A MESSAGE FOR THE NURSE SELECT OPTION 2, PLEASE LEAVE A MESSAGE INCLUDING: YOUR NAME DATE OF BIRTH CALL BACK NUMBER REASON FOR CALL**this is important as we prioritize the call backs  YOU WILL RECEIVE A CALL BACK THE SAME DAY AS LONG AS YOU CALL BEFORE 4:00 PM  At the [facility], you and your health needs are our priority. As part of our continuing mission to provide you with exceptional heart care, we have created designated Provider Care Teams. These Care Teams include your primary Cardiologist (physician) and Advanced Practice Providers (APPs- Physician Assistants and Nurse Practitioners) who all work together to provide you with the care you need, when you need it.   You may see any of the following providers on your designated Care Team at your next follow up: Dr Toribio Fuel Dr Ezra Shuck Dr. Ria Commander Dr. Morene Brownie Amy Lenetta, NP Caffie Shed, [place] [facility], [place] Beckey Coe, NP [place] Lee, NP Ellouise Class, NP Tinnie Redman, PharmD Jaun Bash, PharmD   Please be sure to bring in all your medications bottles to every appointment.    Thank you for choosing [facility]

## 2024-04-03 NOTE — Progress Notes (Signed)
 Cardiomyopathy genetic testing collected via blood  per Dr.Bensimhon  Order form completed, signed and shipped with sample by Fed[employer] to [facility].

## 2024-04-03 NOTE — Progress Notes (Signed)
 ReDS Vest / Clip - 04/03/24 1500       ReDS Vest / Clip   Station Marker C    Ruler Value 30.5    ReDS Value Range Low volume    ReDS Actual Value 34

## 2024-04-04 ENCOUNTER — Ambulatory Visit (HOSPITAL_COMMUNITY): Payer: Self-pay | Admitting: Family Medicine

## 2024-04-04 DIAGNOSIS — I5032 Chronic diastolic (congestive) heart failure: Secondary | ICD-10-CM

## 2024-04-04 NOTE — Addendum Note (Signed)
 Encounter addended by: Glena Harlene HERO, FNP on: 04/04/2024 2:40 PM  Actions taken: Clinical Note Signed

## 2024-04-05 ENCOUNTER — Encounter (HOSPITAL_COMMUNITY): Payer: Self-pay

## 2024-04-07 ENCOUNTER — Encounter (HOSPITAL_COMMUNITY): Payer: Self-pay

## 2024-04-07 LAB — ALPHA GALACTOSIDASE: Alpha-Galactosidase activity: 93.1 nmol/h/mg{prot} (ref >35.5)

## 2024-04-07 MED ORDER — TORSEMIDE 20 MG PO TABS: 40.0000 mg | ORAL_TABLET | Freq: Every day | ORAL | Status: DC

## 2024-04-07 NOTE — Progress Notes (Unsigned)
 Received lab work from  04/03/2024 reviewed by J.milford NP negative for Fabry's disease

## 2024-04-10 ENCOUNTER — Ambulatory Visit (HOSPITAL_COMMUNITY): Payer: Self-pay | Admitting: Family Medicine

## 2024-04-10 NOTE — Addendum Note (Signed)
 Encounter addended by: Dante Jeannine HERO, CMA on: 04/10/2024 2:10 PM  Actions taken: Charge Capture section accepted

## 2024-04-13 ENCOUNTER — Other Ambulatory Visit (HOSPITAL_COMMUNITY): Payer: Self-pay | Admitting: Internal Medicine

## 2024-04-14 ENCOUNTER — Ambulatory Visit (HOSPITAL_COMMUNITY)
Admission: RE | Admit: 2024-04-14 | Discharge: 2024-04-14 | Disposition: A | Discharge status: Home / Self Care | Source: Ambulatory Visit | Attending: Cardiology | Admitting: Cardiology

## 2024-04-14 ENCOUNTER — Ambulatory Visit (HOSPITAL_COMMUNITY): Payer: Self-pay | Admitting: Family Medicine

## 2024-04-14 DIAGNOSIS — I5032 Chronic diastolic (congestive) heart failure: Secondary | ICD-10-CM | POA: Diagnosis not present

## 2024-04-14 LAB — BASIC METABOLIC PANEL WITH GFR
Anion gap: 11 (ref 5–15)
BUN: 36 mg/dL — ABNORMAL HIGH (ref 6–20)
CO2: 25 mmol/L (ref 22–32)
Calcium: 8.8 mg/dL — ABNORMAL LOW (ref 8.9–10.3)
Chloride: 100 mmol/L (ref 98–111)
Creatinine, Ser: 1.65 mg/dL — ABNORMAL HIGH (ref 0.61–1.24)
GFR, Estimated: 51 mL/min — ABNORMAL LOW (ref >60)
Glucose, Bld: 155 mg/dL — ABNORMAL HIGH (ref 70–99)
Potassium: 4.7 mmol/L (ref 3.5–5.1)
Sodium: 136 mmol/L (ref 135–145)

## 2024-04-17 ENCOUNTER — Ambulatory Visit (HOSPITAL_COMMUNITY): Admission: RE | Admit: 2024-04-17 | Source: Ambulatory Visit

## 2024-04-17 ENCOUNTER — Encounter (HOSPITAL_COMMUNITY): Payer: Self-pay | Admitting: Emergency Medicine

## 2024-04-21 ENCOUNTER — Telehealth (HOSPITAL_COMMUNITY): Payer: Self-pay

## 2024-04-25 ENCOUNTER — Telehealth (HOSPITAL_COMMUNITY): Payer: Self-pay | Admitting: *Deleted

## 2024-04-25 NOTE — Telephone Encounter (Signed)
 Reaching out to patient to offer assistance regarding upcoming cardiac imaging study; pt verbalizes understanding of appt date/time, parking situation and where to check in, pre-test NPO status; name and call back number provided for further questions should they arise  Larey Brick RN Navigator Cardiac Imaging [facility] [phone] office [phone] cell  Patient verbalized understanding of diet prep.

## 2024-04-27 ENCOUNTER — Encounter (HOSPITAL_COMMUNITY)
Admission: RE | Admit: 2024-04-27 | Discharge: 2024-04-27 | Disposition: A | Discharge status: Home / Self Care | Source: Ambulatory Visit | Attending: Family Medicine | Admitting: Family Medicine

## 2024-04-27 DIAGNOSIS — I5032 Chronic diastolic (congestive) heart failure: Secondary | ICD-10-CM | POA: Insufficient documentation

## 2024-04-27 MED ORDER — RUBIDIUM RB82 GENERATOR (RUBYFILL)
22.0300 | PACK | Freq: Once | INTRAVENOUS | Status: AC
  Administered 2024-04-27: 22.03 via INTRAVENOUS

## 2024-04-27 MED ORDER — FLUDEOXYGLUCOSE F - 18 (FDG) INJECTION
8.9600 | Freq: Once | INTRAVENOUS | Status: AC
  Administered 2024-04-27: 8.96 via INTRAVENOUS

## 2024-04-28 LAB — NM PET CT MYOCARDIAL SARCOIDOSIS
Nuc Stress EF: 31 %
Rest Nuclear Isotope Dose: 22 mCi

## 2024-05-17 DIAGNOSIS — E291 Testicular hypofunction: Secondary | ICD-10-CM | POA: Diagnosis not present

## 2024-05-25 DIAGNOSIS — N5201 Erectile dysfunction due to arterial insufficiency: Secondary | ICD-10-CM | POA: Diagnosis not present

## 2024-05-25 DIAGNOSIS — E291 Testicular hypofunction: Secondary | ICD-10-CM | POA: Diagnosis not present

## 2024-07-20 ENCOUNTER — Other Ambulatory Visit (HOSPITAL_COMMUNITY): Payer: Self-pay | Admitting: Family Medicine
